# Patient Record
Sex: Female | Born: 1990
Health system: Southern US, Community
[De-identification: ages and names within clinical notes are randomized; demographics above are authoritative.]

## PROBLEM LIST (undated history)

## (undated) DIAGNOSIS — R87629 Unspecified abnormal cytological findings in specimens from vagina: Secondary | ICD-10-CM

## (undated) DIAGNOSIS — T8859XA Other complications of anesthesia, initial encounter: Secondary | ICD-10-CM

## (undated) DIAGNOSIS — T4145XA Adverse effect of unspecified anesthetic, initial encounter: Secondary | ICD-10-CM

## (undated) DIAGNOSIS — R112 Nausea with vomiting, unspecified: Secondary | ICD-10-CM

## (undated) DIAGNOSIS — N200 Calculus of kidney: Secondary | ICD-10-CM

## (undated) DIAGNOSIS — Z9889 Other specified postprocedural states: Secondary | ICD-10-CM

## (undated) DIAGNOSIS — R011 Cardiac murmur, unspecified: Secondary | ICD-10-CM

## (undated) DIAGNOSIS — J45909 Unspecified asthma, uncomplicated: Secondary | ICD-10-CM

## (undated) DIAGNOSIS — B019 Varicella without complication: Secondary | ICD-10-CM

## (undated) DIAGNOSIS — T7840XA Allergy, unspecified, initial encounter: Secondary | ICD-10-CM

## (undated) DIAGNOSIS — J40 Bronchitis, not specified as acute or chronic: Secondary | ICD-10-CM

## (undated) DIAGNOSIS — B977 Papillomavirus as the cause of diseases classified elsewhere: Secondary | ICD-10-CM

## (undated) DIAGNOSIS — I499 Cardiac arrhythmia, unspecified: Secondary | ICD-10-CM

## (undated) DIAGNOSIS — A749 Chlamydial infection, unspecified: Secondary | ICD-10-CM

## (undated) HISTORY — PX: WISDOM TOOTH EXTRACTION: SHX21

## (undated) HISTORY — DX: Varicella without complication: B01.9

## (undated) HISTORY — DX: Allergy, unspecified, initial encounter: T78.40XA

## (undated) HISTORY — DX: Unspecified asthma, uncomplicated: J45.909

## (undated) HISTORY — PX: LEEP: SHX91

## (undated) HISTORY — DX: Bronchitis, not specified as acute or chronic: J40

---
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
## 2010-09-04 ENCOUNTER — Emergency Department (HOSPITAL_COMMUNITY)
Admission: EM | Admit: 2010-09-04 | Discharge: 2010-09-04 | Payer: Self-pay | Source: Home / Self Care | Admitting: Emergency Medicine

## 2013-11-22 ENCOUNTER — Emergency Department: Payer: Self-pay | Admitting: Emergency Medicine

## 2014-03-01 ENCOUNTER — Encounter: Payer: Self-pay | Admitting: Adult Health

## 2014-03-01 ENCOUNTER — Ambulatory Visit (INDEPENDENT_AMBULATORY_CARE_PROVIDER_SITE_OTHER): Payer: BC Managed Care – PPO | Admitting: Adult Health

## 2014-03-01 VITALS — BP 100/66 | HR 72 | Temp 98.3°F | Resp 14 | Ht 67.0 in | Wt 147.8 lb

## 2014-03-01 DIAGNOSIS — Z0000 Encounter for general adult medical examination without abnormal findings: Secondary | ICD-10-CM

## 2014-03-01 LAB — COMPREHENSIVE METABOLIC PANEL
ALBUMIN: 3.7 g/dL (ref 3.5–5.2)
ALK PHOS: 40 U/L (ref 39–117)
ALT: 21 U/L (ref 0–35)
AST: 24 U/L (ref 0–37)
BUN: 16 mg/dL (ref 6–23)
CO2: 27 mEq/L (ref 19–32)
Calcium: 9.3 mg/dL (ref 8.4–10.5)
Chloride: 104 mEq/L (ref 96–112)
Creatinine, Ser: 1 mg/dL (ref 0.4–1.2)
GFR: 75.91 mL/min (ref >60.00)
GLUCOSE: 77 mg/dL (ref 70–99)
POTASSIUM: 4.9 meq/L (ref 3.5–5.1)
SODIUM: 136 meq/L (ref 135–145)
TOTAL PROTEIN: 6.5 g/dL (ref 6.0–8.3)
Total Bilirubin: 0.6 mg/dL (ref 0.2–1.2)

## 2014-03-01 LAB — CBC WITH DIFFERENTIAL/PLATELET
BASOS ABS: 0 10*3/uL (ref 0.0–0.1)
Basophils Relative: 0.3 % (ref 0.0–3.0)
EOS ABS: 0.2 10*3/uL (ref 0.0–0.7)
Eosinophils Relative: 2.6 % (ref 0.0–5.0)
HCT: 41.3 % (ref 36.0–46.0)
HEMOGLOBIN: 14 g/dL (ref 12.0–15.0)
LYMPHS PCT: 47.2 % — AB (ref 12.0–46.0)
Lymphs Abs: 2.8 10*3/uL (ref 0.7–4.0)
MCHC: 33.9 g/dL (ref 30.0–36.0)
MCV: 90.4 fl (ref 78.0–100.0)
Monocytes Absolute: 0.3 10*3/uL (ref 0.1–1.0)
Monocytes Relative: 5.4 % (ref 3.0–12.0)
NEUTROS ABS: 2.6 10*3/uL (ref 1.4–7.7)
NEUTROS PCT: 44.5 % (ref 43.0–77.0)
Platelets: 213 10*3/uL (ref 150.0–400.0)
RBC: 4.57 Mil/uL (ref 3.87–5.11)
RDW: 13.3 % (ref 11.5–15.5)
WBC: 5.9 10*3/uL (ref 4.0–10.5)

## 2014-03-01 LAB — VITAMIN D 25 HYDROXY (VIT D DEFICIENCY, FRACTURES): VITD: 80.94 ng/mL (ref 30.00–100.00)

## 2014-03-01 LAB — LIPID PANEL
CHOLESTEROL: 194 mg/dL (ref 0–200)
HDL: 65.3 mg/dL (ref >39.00)
LDL CALC: 108 mg/dL — AB (ref 0–99)
NONHDL: 128.7
Total CHOL/HDL Ratio: 3
Triglycerides: 104 mg/dL (ref 0.0–149.0)
VLDL: 20.8 mg/dL (ref 0.0–40.0)

## 2014-03-01 LAB — TSH: TSH: 3.84 u[IU]/mL (ref 0.35–4.50)

## 2014-03-01 LAB — VITAMIN B12: Vitamin B-12: 792 pg/mL (ref 211–911)

## 2014-03-01 MED ORDER — EPINEPHRINE 0.3 MG/0.3ML IJ SOAJ: 0.3000 mg | Freq: Once | INTRAMUSCULAR | Status: DC

## 2014-03-01 NOTE — Progress Notes (Signed)
Pre visit review using our clinic review tool, if applicable. No additional management support is needed unless otherwise documented below in the visit note. 

## 2014-03-01 NOTE — Progress Notes (Signed)
Patient ID: Lori Blackwell, female   DOB: 1990-09-26, 23 y.o.   MRN: [id number]   Subjective:    Patient ID: Lori Blackwell, female    DOB: 05-11-1991, 23 y.o.   MRN: [id number]  HPI Lori Blackwell is a very pleasant 23 y/o female who presents to establish care. She is feeling well. No current concerns. She has a hx of shellfish allergies and needs refill on Epi Pen. She also reports family hx of hypoglycemia. She believes she may have occasional symptoms when she spends too much time without eating. No syncope.   Past Medical History  Diagnosis Date  . Asthma   . Allergy   . Bronchitis      Past Surgical History  Procedure Laterality Date  . Appendectomy  2001  . Shoulder surgery Right 2008    2011     Family History  Problem Relation Age of Onset  . Hyperlipidemia Mother   . Cancer Maternal Aunt     breast cancer  . Cancer Maternal Grandmother     breast cancer     History   Social History  . Marital Status: Single    Spouse Name: N/A    Number of Children: 0  . Years of Education: 16   Occupational History  . Kindergarten Teacher     [employer]   Social History Main Topics  . Smoking status: Never Smoker   . Smokeless tobacco: Never Used  . Alcohol Use: Yes     Comment: 1-3 drinks weekly  . Drug Use: No  . Sexual Activity: Not on file   Other Topics Concern  . Not on file   Social History Narrative   Lori Blackwell grew up in [place]. 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 She is working as a Oncologist at [employer]. She lives in [place].      Hobbies: Running, paint with acrylics   Exercise: Regularly attends [employer] and does weights, interval training, cardio   Diet: Follows clean eating and allows herself 3 cheat meals weekly.     Review of Systems  Constitutional: Negative.   HENT: Negative.   Eyes: Negative.   Respiratory: Negative.   Cardiovascular: Negative.     Gastrointestinal: Negative.   Endocrine: Negative.   Genitourinary: Negative.   Musculoskeletal: Negative.   Skin: Negative.   Allergic/Immunologic: Negative.   Neurological: Negative.   Hematological: Negative.   Psychiatric/Behavioral: Negative.        Objective:  BP 100/66  Pulse 72  Temp(Src) 98.3 F (36.8 C) (Oral)  Resp 14  Ht 5\' 7"  (1.702 m)  Wt 147 lb 12 oz (67.019 kg)  BMI 23.14 kg/m2  SpO2 99%  LMP 02/15/2014   Physical Exam  Constitutional: She is oriented to person, place, and time. She appears well-developed and well-nourished. No distress.  HENT:  Head: Normocephalic and atraumatic.  Right Ear: External ear normal.  Left Ear: External ear normal.  Nose: Nose normal.  Mouth/Throat: Oropharynx is clear and moist.  Eyes: Conjunctivae and EOM are normal. Pupils are equal, round, and reactive to light.  Neck: Normal range of motion. Neck supple. No tracheal deviation present. No thyromegaly present.  Cardiovascular: Normal rate, regular rhythm, normal heart sounds and intact distal pulses.  Exam reveals no gallop and no friction rub.   No murmur heard. Pulmonary/Chest: Effort normal and breath sounds normal. No respiratory distress. She has no wheezes. She has no rales.  Abdominal: Soft. Bowel sounds are  normal. She exhibits no distension and no mass. There is no tenderness. There is no rebound and no guarding.  Musculoskeletal: Normal range of motion. She exhibits no edema and no tenderness.  Lymphadenopathy:    She has no cervical adenopathy.  Neurological: She is alert and oriented to person, place, and time. She has normal reflexes. No cranial nerve deficit. Coordination normal.  Skin: Skin is warm and dry.  Psychiatric: She has a normal mood and affect. Her behavior is normal. Judgment and thought content normal.      Assessment & Plan:   1. Routine general medical examination at a health care facility Normal physical exam. She is followed by GYN and  up to date on PAP which was normal. Check labs. Return in 1 year.  - TSH - CBC with Differential - Vit D  25 hydroxy (rtn osteoporosis monitoring) - Vitamin B12 - Lipid panel - Comprehensive metabolic panel

## 2014-03-01 NOTE — Patient Instructions (Signed)
   Thank you for choosing [facility] for your health care needs.  Please have your labs drawn prior to leaving the office.  The results will be available through MyChart for your convenience. Please remember to activate this. The activation code is located at the end of this form.  I have sent in a prescription for Epi Pen to your pharmacy along with refills. Keep this with you at all times.

## 2014-05-09 ENCOUNTER — Ambulatory Visit (INDEPENDENT_AMBULATORY_CARE_PROVIDER_SITE_OTHER): Payer: BC Managed Care – PPO | Admitting: Internal Medicine

## 2014-05-09 ENCOUNTER — Encounter: Payer: Self-pay | Admitting: Internal Medicine

## 2014-05-09 ENCOUNTER — Encounter (INDEPENDENT_AMBULATORY_CARE_PROVIDER_SITE_OTHER): Payer: Self-pay

## 2014-05-09 VITALS — BP 98/52 | HR 70 | Temp 98.4°F | Wt 150.0 lb

## 2014-05-09 DIAGNOSIS — T3695XA Adverse effect of unspecified systemic antibiotic, initial encounter: Secondary | ICD-10-CM

## 2014-05-09 DIAGNOSIS — J0100 Acute maxillary sinusitis, unspecified: Secondary | ICD-10-CM

## 2014-05-09 DIAGNOSIS — B379 Candidiasis, unspecified: Secondary | ICD-10-CM

## 2014-05-09 MED ORDER — AMOXICILLIN-POT CLAVULANATE 875-125 MG PO TABS: 1.0000 | ORAL_TABLET | Freq: Two times a day (BID) | ORAL | Status: DC

## 2014-05-09 MED ORDER — FLUCONAZOLE 150 MG PO TABS: 150.0000 mg | ORAL_TABLET | Freq: Once | ORAL | Status: DC

## 2014-05-09 NOTE — Progress Notes (Signed)
HPI  Pt presents to the clinic today with c/o nasal congestion, post nasal drip and bilateral ear pain. She reports this started 2-3 weeks ago. She is blowing green mucous out of her nose. She denies fever but has had chills and body aches. She has tried sudafed, ibuprofen and dayquil without any relief. She does have a history of allergies and asthma. She also reports a history of recurrent sinus infections. She has not had sick contacts that she is aware of.  Review of Systems      Past Medical History  Diagnosis Date  . Asthma   . Allergy   . Bronchitis     Family History  Problem Relation Age of Onset  . Hyperlipidemia Mother   . Cancer Maternal Aunt     breast cancer  . Cancer Maternal Grandmother     breast cancer    History   Social History  . Marital Status: Single    Spouse Name: N/A    Number of Children: 0  . Years of Education: 16   Occupational History  . Kindergarten Teacher     [employer]   Social History Main Topics  . Smoking status: Never Smoker   . Smokeless tobacco: Never Used  . Alcohol Use: Yes     Comment: 1-3 drinks weekly  . Drug Use: No  . Sexual Activity: Not on file   Other Topics Concern  . Not on file   Social History Narrative   Lori Blackwell grew up in [place]. 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      Hobbies: Running, paint with acrylics   Exercise: Regularly attends [employer] and does weights, interval training, cardio   Diet: Follows clean eating and allows herself 3 cheat meals weekly.    Allergies  Allergen Reactions  . Shellfish Allergy Anaphylaxis  . Sulfa Antibiotics Rash     Constitutional:  Denies headache, fatigue, fever or abrupt weight changes.  HEENT:  Positive nasal congestion, ear fullness, sore throat. Denies eye redness, eye pain, pressure behind the eyes, facial pain,   ear pain, ringing in the ears, wax buildup, runny nose or bloody nose. Respiratory:  Denies cough, difficulty breathing or shortness of breath.  Cardiovascular: Denies chest pain, chest tightness, palpitations or swelling in the hands or feet.   No other specific complaints in a complete review of systems (except as listed in HPI above).  Objective:   BP 98/52  Pulse 70  Temp(Src) 98.4 F (36.9 C) (Oral)  Wt 150 lb (68.04 kg)  SpO2 99%  Wt Readings from Last 3 Encounters:  05/09/14 150 lb (68.04 kg)  03/01/14 147 lb 12 oz (67.019 kg)     General: Appears her stated age, well developed, well nourished in NAD. HEENT: Head: normal shape and size; maxillary and frontal tenderness; Ears: Tm's gray and intact, normal light reflex; Nose: mucosa pink and moist, septum midline; Throat/Mouth: + PND. Teeth present, mucosa erythematous and moist, no exudate noted, no lesions or ulcerations noted.  Cardiovascular: Normal rate and rhythm. S1,S2 noted.  No murmur, rubs or gallops noted. No JVD or BLE edema. No carotid bruits noted. Pulmonary/Chest: Normal effort and positive vesicular breath sounds. No respiratory distress. No wheezes, rales or ronchi noted.      Assessment & Plan:   Acute Bacterial Sinusitis:  Get some rest and drink plenty of water Do  salt water gargles for the sore throat Start flonase OTC eRx for Augmentin BID x 10 days eRx for diflucan for antibiotic induced yeast infection  RTC as needed or if symptoms persist.

## 2014-05-09 NOTE — Patient Instructions (Addendum)

## 2014-05-09 NOTE — Progress Notes (Signed)
Pre visit review using our clinic review tool, if applicable. No additional management support is needed unless otherwise documented below in the visit note. 

## 2014-06-20 LAB — HM PAP SMEAR: HM Pap smear: NORMAL

## 2014-07-26 ENCOUNTER — Encounter: Payer: Self-pay | Admitting: Internal Medicine

## 2014-07-26 ENCOUNTER — Ambulatory Visit (INDEPENDENT_AMBULATORY_CARE_PROVIDER_SITE_OTHER): Payer: BC Managed Care – PPO | Admitting: Internal Medicine

## 2014-07-26 VITALS — BP 106/68 | HR 69 | Temp 98.2°F | Wt 150.0 lb

## 2014-07-26 DIAGNOSIS — J0100 Acute maxillary sinusitis, unspecified: Secondary | ICD-10-CM

## 2014-07-26 MED ORDER — ALBUTEROL SULFATE HFA 108 (90 BASE) MCG/ACT IN AERS
2.0000 | INHALATION_SPRAY | Freq: Four times a day (QID) | RESPIRATORY_TRACT | Status: DC | PRN
Start: 2014-07-26 — End: 2015-09-16

## 2014-07-26 MED ORDER — CETIRIZINE HCL 10 MG PO TABS: 10.0000 mg | ORAL_TABLET | Freq: Every day | ORAL | Status: DC

## 2014-07-26 MED ORDER — AMOXICILLIN 500 MG PO CAPS: 500.0000 mg | ORAL_CAPSULE | Freq: Three times a day (TID) | ORAL | Status: DC

## 2014-07-26 NOTE — Progress Notes (Signed)
Pre visit review using our clinic review tool, if applicable. No additional management support is needed unless otherwise documented below in the visit note. 

## 2014-07-26 NOTE — Patient Instructions (Signed)

## 2014-07-26 NOTE — Progress Notes (Signed)
HPI  Pt presents to the clinic today with c/o headache, facial pressure, nasal congestion, ear fullness, sore throat and cough. She reports this started 3 weeks ago. She is blowing green mucous from her nose. She is also coughing up green mucous. She denies fever but has had chills and body aches. She has tried Nyqil and Dayquil without minimal relief. She does have a history of asthma and allergies. She is not taking any zyrtec or nasocort. She has had sick contacts.  Review of Systems      Past Medical History  Diagnosis Date  . Asthma   . Allergy   . Bronchitis     Family History  Problem Relation Age of Onset  . Hyperlipidemia Mother   . Cancer Maternal Aunt     breast cancer  . Cancer Maternal Grandmother     breast cancer    History   Social History  . Marital Status: Single    Spouse Name: N/A    Number of Children: 0  . Years of Education: 16   Occupational History  . Kindergarten Teacher     [employer]   Social History Main Topics  . Smoking status: Never Smoker   . Smokeless tobacco: Never Used  . Alcohol Use: Yes     Comment: 1-3 drinks weekly  . Drug Use: No  . Sexual Activity: Not on file   Other Topics Concern  . Not on file   Social History Narrative   Lori Blackwell grew up in [place]. 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      Hobbies: Running, paint with acrylics   Exercise: Regularly attends [employer] and does weights, interval training, cardio   Diet: Follows clean eating and allows herself 3 cheat meals weekly.    Allergies  Allergen Reactions  . Shellfish Allergy Anaphylaxis  . Sulfa Antibiotics Rash     Constitutional: Positive headache, fatigue. Denies fever or abrupt weight changes.  HEENT:  Positive ear fullness, nasal congestion, sore throat. Denies eye redness, eye pain, pressure behind the  eyes, facial pain, ear pain, ringing in the ears, wax buildup, runny nose or bloody nose. Respiratory: Positive cough. Denies difficulty breathing or shortness of breath.  Cardiovascular: Denies chest pain, chest tightness, palpitations or swelling in the hands or feet.   No other specific complaints in a complete review of systems (except as listed in HPI above).  Objective:   BP 106/68 mmHg  Pulse 69  Temp(Src) 98.2 F (36.8 C) (Oral)  Wt 150 lb (68.04 kg)  SpO2 98% Wt Readings from Last 3 Encounters:  07/26/14 150 lb (68.04 kg)  05/09/14 150 lb (68.04 kg)  03/01/14 147 lb 12 oz (67.019 kg)     General: Appears her stated age, ill appearing in NAD. HEENT: Head: normal shape and size, maxillary sinus tenderness noted; Eyes: sclera white, no icterus, conjunctiva pink; Ears: Tm's gray and intact, normal light reflex, + effusion bilaterally; Nose: mucosa boggy and moist, septum midline; Throat/Mouth: + PND. Teeth present, mucosa erythematous and moist, no exudate noted, no lesions or ulcerations noted.  Neck: Cervical adenopathy noted. Cardiovascular: Normal rate and rhythm. S1,S2 noted.  No murmur, rubs or gallops noted. . Pulmonary/Chest: Normal effort and positive vesicular breath sounds. No respiratory distress. No wheezes, rales or ronchi noted.      Assessment & Plan:   Acute Maxillary Sinusitis:  Get some rest and drink plenty of water Do salt water gargles/ibuprofen for the sore throat eRx for Amoxil TID x 10 days Delsym OTC for cough  RTC as needed or if symptoms persist.

## 2014-09-13 ENCOUNTER — Ambulatory Visit (INDEPENDENT_AMBULATORY_CARE_PROVIDER_SITE_OTHER): Payer: BC Managed Care – PPO | Admitting: Nurse Practitioner

## 2014-09-13 ENCOUNTER — Encounter: Payer: Self-pay | Admitting: Nurse Practitioner

## 2014-09-13 ENCOUNTER — Encounter (INDEPENDENT_AMBULATORY_CARE_PROVIDER_SITE_OTHER): Payer: Self-pay

## 2014-09-13 VITALS — BP 110/64 | HR 79 | Temp 98.0°F | Resp 12 | Ht 67.0 in | Wt 148.8 lb

## 2014-09-13 DIAGNOSIS — B9689 Other specified bacterial agents as the cause of diseases classified elsewhere: Secondary | ICD-10-CM

## 2014-09-13 DIAGNOSIS — J019 Acute sinusitis, unspecified: Secondary | ICD-10-CM

## 2014-09-13 MED ORDER — AMOXICILLIN 500 MG PO TABS: 500.0000 mg | ORAL_TABLET | Freq: Two times a day (BID) | ORAL | Status: DC

## 2014-09-13 MED ORDER — TRIAMCINOLONE ACETONIDE 55 MCG/ACT NA AERO: 2.0000 | INHALATION_SPRAY | Freq: Every day | NASAL | Status: DC

## 2014-09-13 MED ORDER — FLUCONAZOLE 150 MG PO TABS: 150.0000 mg | ORAL_TABLET | Freq: Once | ORAL | Status: DC

## 2014-09-13 MED ORDER — PSEUDOEPHEDRINE-CODEINE-GG 30-10-100 MG/5ML PO SOLN: 5.0000 mL | Freq: Every evening | ORAL | Status: DC | PRN

## 2014-09-13 NOTE — Progress Notes (Signed)
   Subjective:    Patient ID: Lori Blackwell, female    DOB: Mar 16, 1991, 24 y.o.   MRN: [id number]  HPI  Lori Blackwell is a 24 yo female with a CC of cough with green/brown phlegm x 10 days.   1) Worsening- left side of face is painful, swollen, teeth hurt, coughing- productive  Nasal drainage brown and green No fevers   Tylenol cold/flu- not helpful  Mucinex DM- Helpful   Review of Systems  Constitutional: Positive for fatigue. Negative for fever, chills and diaphoresis.  HENT: Positive for congestion, ear pain, facial swelling, postnasal drip, rhinorrhea, sinus pressure, sneezing and sore throat. Negative for ear discharge.   Eyes: Positive for visual disturbance.       Blurred vision yesterday, fatigue related  Respiratory: Positive for cough and wheezing. Negative for chest tightness.   Cardiovascular: Negative for chest pain, palpitations and leg swelling.  Gastrointestinal: Negative for nausea, vomiting and diarrhea.  Skin: Negative for rash.  Neurological: Positive for headaches. Negative for light-headedness.       Objective:   Physical Exam  Constitutional: She is oriented to person, place, and time. She appears well-developed and well-nourished. No distress.  BP 110/64 mmHg  Pulse 79  Temp(Src) 98 F (36.7 C) (Oral)  Resp 12  Ht 5\' 7"  (1.702 m)  Wt 148 lb 12.8 oz (67.495 kg)  BMI 23.30 kg/m2  SpO2 97%  LMP  (Approximate)   HENT:  Head: Normocephalic and atraumatic.  Right Ear: External ear normal.  Left Ear: External ear normal.  Cardiovascular: Normal rate, regular rhythm, normal heart sounds and intact distal pulses.  Exam reveals no gallop and no friction rub.   No murmur heard. Pulmonary/Chest: Effort normal and breath sounds normal. No respiratory distress. She has no wheezes. She has no rales. She exhibits no tenderness.  Neurological: She is alert and oriented to person, place, and time. No cranial nerve deficit. She exhibits normal muscle tone.  Coordination normal.  Skin: Skin is warm and dry. No rash noted. She is not diaphoretic.  Psychiatric: She has a normal mood and affect. Her behavior is normal. Judgment and thought content normal.      Assessment & Plan:

## 2014-09-13 NOTE — Patient Instructions (Signed)
Call us if worsens or fails to improve.

## 2014-09-13 NOTE — Progress Notes (Signed)
Pre visit review using our clinic review tool, if applicable. No additional management support is needed unless otherwise documented below in the visit note. 

## 2014-09-14 DIAGNOSIS — J019 Acute sinusitis, unspecified: Principal | ICD-10-CM

## 2014-09-14 DIAGNOSIS — B9689 Other specified bacterial agents as the cause of diseases classified elsewhere: Secondary | ICD-10-CM | POA: Insufficient documentation

## 2014-09-14 NOTE — Assessment & Plan Note (Signed)
10 days of worsening. Will try a amoxicillin 500 mg twice daily for 10 days. Diflucan in case of yeast infection. FU prn worsening/failure to improve.

## 2015-05-06 ENCOUNTER — Encounter: Payer: Self-pay | Admitting: Nurse Practitioner

## 2015-05-06 ENCOUNTER — Ambulatory Visit (INDEPENDENT_AMBULATORY_CARE_PROVIDER_SITE_OTHER): Payer: BC Managed Care – PPO | Admitting: Nurse Practitioner

## 2015-05-06 VITALS — BP 106/68 | HR 70 | Temp 98.5°F | Resp 14 | Ht 67.0 in | Wt 146.2 lb

## 2015-05-06 DIAGNOSIS — H1010 Acute atopic conjunctivitis, unspecified eye: Secondary | ICD-10-CM | POA: Insufficient documentation

## 2015-05-06 DIAGNOSIS — H1012 Acute atopic conjunctivitis, left eye: Secondary | ICD-10-CM | POA: Diagnosis not present

## 2015-05-06 NOTE — Assessment & Plan Note (Addendum)
Probable start to allergic conjunctivitis. Gave AVS with information regarding treatment and course of illness. Advised pt to get Visine-A eye drops for relief of symptoms and try another allergy medication like Claritin or Allegra. FU prn worsening/failure to improve.

## 2015-05-06 NOTE — Patient Instructions (Signed)
Visine A- over the counter for relief of eye symptoms.  Try another medication for allergies like Claritin or Allegra  Allergic Conjunctivitis The conjunctiva is a thin membrane that covers the visible white part of the eyeball and the underside of the eyelids. This membrane protects and lubricates the eye. The membrane has small blood vessels running through it that can normally be seen. When the conjunctiva becomes inflamed, the condition is called conjunctivitis. In response to the inflammation, the conjunctival blood vessels become swollen. The swelling results in redness in the normally white part of the eye. The blood vessels of this membrane also react when a person has allergies and is then called allergic conjunctivitis. This condition usually lasts for as long as the allergy persists. Allergic conjunctivitis cannot be passed to another person (non-contagious). The likelihood of bacterial infection is great and the cause is not likely due to allergies if the inflamed eye has:  A sticky discharge.  Discharge or sticking together of the lids in the morning.  Scaling or flaking of the eyelids where the eyelashes come out.  Red swollen eyelids. CAUSES   Viruses.  Irritants such as foreign bodies.  Chemicals.  General allergic reactions.  Inflammation or serious diseases in the inside or the outside of the eye or the orbit (the boney cavity in which the eye sits) can cause a "red eye." SYMPTOMS   Eye redness.  Tearing.  Itchy eyes.  Burning feeling in the eyes.  Clear drainage from the eye.  Allergic reaction due to pollens or ragweed sensitivity. Seasonal allergic conjunctivitis is frequent in the spring when pollens are in the air and in the fall. DIAGNOSIS  This condition, in its many forms, is usually diagnosed based on the history and an ophthalmological exam. It usually involves both eyes. If your eyes react at the same time every year, allergies may be the cause.  While most "red eyes" are due to allergy or an infection, the role of an eye (ophthalmological) exam is important. The exam can rule out serious diseases of the eye or orbit. TREATMENT   Non-antibiotic eye drops, ointments, or medications by mouth may be prescribed if the ophthalmologist is sure the conjunctivitis is due to allergies alone.  Over-the-counter drops and ointments for allergic symptoms should be used only after other causes of conjunctivitis have been ruled out, or as your caregiver suggests. Medications by mouth are often prescribed if other allergy-related symptoms are present. If the ophthalmologist is sure that the conjunctivitis is due to allergies alone, treatment is normally limited to drops or ointments to reduce itching and burning. HOME CARE INSTRUCTIONS   Wash hands before and after applying drops or ointments, or touching the inflamed eye(s) or eyelids.  Do not let the eye dropper tip or ointment tube touch the eyelid when putting medicine in your eye.  Stop using your soft contact lenses and throw them away. Use a new pair of lenses when recovery is complete. You should run through sterilizing cycles at least three times before use after complete recovery if the old soft contact lenses are to be used. Hard contact lenses should be stopped. They need to be thoroughly sterilized before use after recovery.  Itching and burning eyes due to allergies is often relieved by using a cool cloth applied to closed eye(s). SEEK MEDICAL CARE IF:   Your problems do not go away after two or three days of treatment.  Your lids are sticky (especially in the morning when you  wake up) or stick together.  Discharge develops. Antibiotics may be needed either as drops, ointment, or by mouth.  You have extreme light sensitivity.  An oral temperature above 102 F (38.9 C) develops.  Pain in or around the eye or any other visual symptom develops. MAKE SURE YOU:   Understand these  instructions.  Will watch your condition.  Will get help right away if you are not doing well or get worse. Document Released: 10/16/2002 Document Revised: 10/18/2011 Document Reviewed: 09/11/2007 [facility] Patient Information 2015 [place], [place]. This information is not intended to replace advice given to you by your health care provider. Make sure you discuss any questions you have with your health care provider.

## 2015-05-06 NOTE — Progress Notes (Signed)
Patient ID: Lori Blackwell, female    DOB: 07/01/1991  Age: 24 y.o. MRN: [id number]  CC: Conjunctivitis   HPI Lori Blackwell presents for left eye redness x 2 days.   1) Yesterday started with feeling like something was in it.                     Stopped after 1 hour, tender, and red This morning woke up with puffiness and itchy, and redness   Zyrtec- not helpful (took 2 months ago for 1 month)  Nasal spray- helpful   History Lori Blackwell has a past medical history of Asthma; Allergy; and Bronchitis.   She has past surgical history that includes Appendectomy (2001) and Shoulder surgery (Right, 2008).   Her family history includes Cancer in her maternal aunt and maternal grandmother; Hyperlipidemia in her mother.She reports that she has never smoked. She has never used smokeless tobacco. She reports that she drinks alcohol. She reports that she does not use illicit drugs.  Outpatient Prescriptions Prior to Visit  Medication Sig Dispense Refill  . albuterol (PROVENTIL HFA;VENTOLIN HFA) 108 (90 BASE) MCG/ACT inhaler Inhale 2 puffs into the lungs every 6 (six) hours as needed for wheezing or shortness of breath. 1 Inhaler 0  . amoxicillin (AMOXIL) 500 MG tablet Take 1 tablet (500 mg total) by mouth 2 (two) times daily. 20 tablet 0  . cetirizine (ZYRTEC) 10 MG tablet Take 1 tablet (10 mg total) by mouth daily. 30 tablet 11  . EPINEPHrine 0.3 mg/0.3 mL IJ SOAJ injection Inject 0.3 mLs (0.3 mg total) into the muscle once. 2 Device 3  . fluconazole (DIFLUCAN) 150 MG tablet Take 1 tablet (150 mg total) by mouth once. 1 tablet 0  . Multiple Vitamin (MULTI VITAMIN DAILY PO) Take 1 capsule by mouth daily.    . norethindrone-ethinyl estradiol (MICROGESTIN,JUNEL,LOESTRIN) 1-20 MG-MCG tablet Take 1 tablet by mouth daily.    . pseudoephedrine-codeine-guaifenesin (MYTUSSIN DAC) 30-10-100 MG/5ML solution Take 5 mLs by mouth at bedtime as needed for cough. 120 mL 0  . triamcinolone (NASACORT AQ) 55 MCG/ACT  AERO nasal inhaler Place 2 sprays into the nose daily. 1 Inhaler 2   No facility-administered medications prior to visit.    ROS Review of Systems  Constitutional: Negative for fever, chills, diaphoresis and fatigue.  HENT: Positive for sinus pressure.   Eyes: Positive for pain, discharge, redness and itching. Negative for visual disturbance.       Left eye watery  Respiratory: Negative for chest tightness, shortness of breath and wheezing.   Cardiovascular: Negative for chest pain, palpitations and leg swelling.  Gastrointestinal: Negative for nausea, vomiting and diarrhea.  Skin: Negative for rash.  Neurological: Negative for dizziness, weakness, numbness and headaches.  Psychiatric/Behavioral: The patient is not nervous/anxious.     Objective:  BP 106/68 mmHg  Pulse 70  Temp(Src) 98.5 F (36.9 C)  Resp 14  Ht 5\' 7"  (1.702 m)  Wt 146 lb 3.2 oz (66.316 kg)  BMI 22.89 kg/m2  SpO2 98%  Physical Exam  Constitutional: She is oriented to person, place, and time. She appears well-developed and well-nourished. No distress.  HENT:  Head: Normocephalic and atraumatic.  Right Ear: External ear normal.  Left Ear: External ear normal.  Mouth/Throat: No oropharyngeal exudate.  TM's clear bilaterally  Eyes: EOM are normal. Pupils are equal, round, and reactive to light. Right eye exhibits no discharge. Left eye exhibits no discharge. No scleral icterus.  Watery left eye with some redness medially,  normal conjunctiva  Cardiovascular: Normal rate, regular rhythm and normal heart sounds.  Exam reveals no gallop and no friction rub.   No murmur heard. Pulmonary/Chest: Effort normal and breath sounds normal. No respiratory distress. She has no wheezes. She has no rales. She exhibits no tenderness.  Neurological: She is alert and oriented to person, place, and time. No cranial nerve deficit. She exhibits normal muscle tone. Coordination normal.  Skin: Skin is warm and dry. No rash noted. She  is not diaphoretic.  Psychiatric: She has a normal mood and affect. Her behavior is normal. Judgment and thought content normal.   Assessment & Plan:   Lori Blackwell was seen today for conjunctivitis.  Diagnoses and all orders for this visit:  Allergic conjunctivitis, left   I am having Ms. Blackwell maintain her norethindrone-ethinyl estradiol, Multiple Vitamin (MULTI VITAMIN DAILY PO), EPINEPHrine, albuterol, cetirizine, pseudoephedrine-codeine-guaifenesin, amoxicillin, fluconazole, and triamcinolone.  No orders of the defined types were placed in this encounter.     Follow-up: Return if symptoms worsen or fail to improve.

## 2015-06-18 ENCOUNTER — Ambulatory Visit (INDEPENDENT_AMBULATORY_CARE_PROVIDER_SITE_OTHER): Payer: BC Managed Care – PPO | Admitting: Nurse Practitioner

## 2015-06-18 ENCOUNTER — Encounter: Payer: Self-pay | Admitting: Nurse Practitioner

## 2015-06-18 VITALS — BP 116/60 | HR 66 | Temp 98.3°F | Wt 146.0 lb

## 2015-06-18 DIAGNOSIS — J019 Acute sinusitis, unspecified: Secondary | ICD-10-CM

## 2015-06-18 DIAGNOSIS — B9689 Other specified bacterial agents as the cause of diseases classified elsewhere: Secondary | ICD-10-CM

## 2015-06-18 MED ORDER — AMOXICILLIN-POT CLAVULANATE 875-125 MG PO TABS: 1.0000 | ORAL_TABLET | Freq: Two times a day (BID) | ORAL | Status: DC

## 2015-06-18 MED ORDER — FLUCONAZOLE 150 MG PO TABS: 150.0000 mg | ORAL_TABLET | Freq: Once | ORAL | Status: DC

## 2015-06-18 MED ORDER — HYDROCOD POLST-CPM POLST ER 10-8 MG/5ML PO SUER: 5.0000 mL | Freq: Every evening | ORAL | Status: DC | PRN

## 2015-06-18 NOTE — Progress Notes (Signed)
Patient ID: Lori Blackwell, female    DOB: 1991/05/09  Age: 24 y.o. MRN: [id number]  CC: Sinusitis   HPI Lori Blackwell presents for CC of sinusitis x 2 weeks.   1) Patient has had symptoms ongoing 2 weeks 1 week of drainage, headache, facial pressure worsening since last week. Maxillary sinus pressure and tooth pain.  Treatment today includes DayQuil and nasal sprays with temporary relief.  Patient was sent home from work today because Lori Blackwell has a burning in the lung, hoarseness, and mucus is green to brown.   History Lori Blackwell has a past medical history of Asthma; Allergy; and Bronchitis.   Lori Blackwell has past surgical history that includes Appendectomy (2001) and Shoulder surgery (Right, 2008).   Lori Blackwell family history includes Cancer in Lori Blackwell maternal aunt and maternal grandmother; Hyperlipidemia in Lori Blackwell mother.Lori Blackwell reports that Lori Blackwell has never smoked. Lori Blackwell has never used smokeless tobacco. Lori Blackwell reports that Lori Blackwell drinks alcohol. Lori Blackwell reports that Lori Blackwell does not use illicit drugs.  Outpatient Prescriptions Prior to Visit  Medication Sig Dispense Refill  . albuterol (PROVENTIL HFA;VENTOLIN HFA) 108 (90 BASE) MCG/ACT inhaler Inhale 2 puffs into the lungs every 6 (six) hours as needed for wheezing or shortness of breath. 1 Inhaler 0  . cetirizine (ZYRTEC) 10 MG tablet Take 1 tablet (10 mg total) by mouth daily. 30 tablet 11  . EPINEPHrine 0.3 mg/0.3 mL IJ SOAJ injection Inject 0.3 mLs (0.3 mg total) into the muscle once. 2 Device 3  . Multiple Vitamin (MULTI VITAMIN DAILY PO) Take 1 capsule by mouth daily.    . norethindrone-ethinyl estradiol (MICROGESTIN,JUNEL,LOESTRIN) 1-20 MG-MCG tablet Take 1 tablet by mouth daily.    . pseudoephedrine-codeine-guaifenesin (MYTUSSIN DAC) 30-10-100 MG/5ML solution Take 5 mLs by mouth at bedtime as needed for cough. 120 mL 0  . triamcinolone (NASACORT AQ) 55 MCG/ACT AERO nasal inhaler Place 2 sprays into the nose daily. 1 Inhaler 2  . amoxicillin (AMOXIL) 500 MG tablet Take 1  tablet (500 mg total) by mouth 2 (two) times daily. 20 tablet 0  . fluconazole (DIFLUCAN) 150 MG tablet Take 1 tablet (150 mg total) by mouth once. 1 tablet 0   No facility-administered medications prior to visit.    ROS Review of Systems  Constitutional: Negative for fever, chills, diaphoresis and fatigue.  HENT: Positive for congestion, ear pain, postnasal drip, rhinorrhea, sinus pressure, sore throat and voice change. Negative for sneezing and trouble swallowing.        Hoarseness  Eyes: Negative for visual disturbance.  Respiratory: Positive for cough and shortness of breath. Negative for chest tightness and wheezing.   Cardiovascular: Negative for chest pain, palpitations and leg swelling.  Gastrointestinal: Negative for nausea, vomiting and diarrhea.  Neurological: Positive for headaches. Negative for dizziness.   Objective:  BP 116/60 mmHg  Pulse 66  Temp(Src) 98.3 F (36.8 C) (Oral)  Wt 146 lb (66.225 kg)  SpO2 96%  Physical Exam  Constitutional: Lori Blackwell is oriented to person, place, and time. Lori Blackwell appears well-developed and well-nourished. No distress.  HENT:  Head: Normocephalic and atraumatic.  Right Ear: External ear normal.  Left Ear: External ear normal.  Mouth/Throat: Oropharynx is clear and moist. No oropharyngeal exudate.  TMs clear bilaterally  Eyes: EOM are normal. Pupils are equal, round, and reactive to light. Right eye exhibits no discharge. Left eye exhibits no discharge. No scleral icterus.  Neck: Normal range of motion. Neck supple.  Cardiovascular: Normal rate, regular rhythm and normal heart sounds.  Exam reveals no gallop and  no friction rub.   No murmur heard. Pulmonary/Chest: Effort normal and breath sounds normal. No respiratory distress. Lori Blackwell has no wheezes. Lori Blackwell has no rales. Lori Blackwell exhibits no tenderness.  Lymphadenopathy:    Lori Blackwell has no cervical adenopathy.  Neurological: Lori Blackwell is alert and oriented to person, place, and time. No cranial nerve deficit.  Lori Blackwell exhibits normal muscle tone. Coordination normal.  Skin: Skin is warm and dry. No rash noted. Lori Blackwell is not diaphoretic.  Psychiatric: Lori Blackwell has a normal mood and affect. Lori Blackwell behavior is normal. Judgment and thought content normal.   Assessment & Plan:   Lori Blackwell was seen today for sinusitis.  Diagnoses and all orders for this visit:  Acute bacterial rhinosinusitis  Other orders -     chlorpheniramine-HYDROcodone (TUSSIONEX PENNKINETIC ER) 10-8 MG/5ML SUER; Take 5 mLs by mouth at bedtime as needed for cough. -     amoxicillin-clavulanate (AUGMENTIN) 875-125 MG tablet; Take 1 tablet by mouth 2 (two) times daily. -     fluconazole (DIFLUCAN) 150 MG tablet; Take 1 tablet (150 mg total) by mouth once.   I have discontinued Lori Blackwell's amoxicillin. I am also having Lori Blackwell start on chlorpheniramine-HYDROcodone and amoxicillin-clavulanate. Additionally, I am having Lori Blackwell maintain Lori Blackwell norethindrone-ethinyl estradiol, Multiple Vitamin (MULTI VITAMIN DAILY PO), EPINEPHrine, albuterol, cetirizine, pseudoephedrine-codeine-guaifenesin, triamcinolone, and fluconazole.  Meds ordered this encounter  Medications  . chlorpheniramine-HYDROcodone (TUSSIONEX PENNKINETIC ER) 10-8 MG/5ML SUER    Sig: Take 5 mLs by mouth at bedtime as needed for cough.    Dispense:  115 mL    Refill:  0    Order Specific Question:  Supervising Provider    Answer:  Derrel Nip, TERESA L [[id number]]  . amoxicillin-clavulanate (AUGMENTIN) 875-125 MG tablet    Sig: Take 1 tablet by mouth 2 (two) times daily.    Dispense:  14 tablet    Refill:  0    Order Specific Question:  Supervising Provider    Answer:  Deborra Medina L [[id number]]  . fluconazole (DIFLUCAN) 150 MG tablet    Sig: Take 1 tablet (150 mg total) by mouth once.    Dispense:  1 tablet    Refill:  0    Order Specific Question:  Supervising Provider    Answer:  Crecencio Mc [[id number]]     Follow-up: Return if symptoms worsen or fail to improve.

## 2015-06-18 NOTE — Assessment & Plan Note (Signed)
Worsening symptoms over 2 weeks. Will do Augmentin twice daily for 7 days. Sending and Diflucan just in case. Gave patient cough syrup prescription to take into pharmacy with precautions outlined verbally and written.

## 2015-06-18 NOTE — Patient Instructions (Signed)

## 2015-06-30 ENCOUNTER — Telehealth: Payer: Self-pay | Admitting: *Deleted

## 2015-06-30 NOTE — Telephone Encounter (Signed)
Patient was seen for a Sinus infection a little  over a week, however her sinus infection has not cleared up, patient requested another antibiotic, or appt. Please advise

## 2015-06-30 NOTE — Telephone Encounter (Signed)
Please advise 

## 2015-07-01 ENCOUNTER — Other Ambulatory Visit: Payer: Self-pay | Admitting: Nurse Practitioner

## 2015-07-01 LAB — HM PAP SMEAR

## 2015-07-01 MED ORDER — METHYLPREDNISOLONE 4 MG PO TABS: ORAL_TABLET | ORAL | Status: DC

## 2015-07-01 NOTE — Telephone Encounter (Signed)
Spoke with the patient, verbalized understanding.

## 2015-07-01 NOTE — Telephone Encounter (Signed)
I will call in a round of prednisone to her pharmacy. Here are the directions:   Prednisone with breakfast or lunch at the latest.  6 tablets on day 1, 5 tablets on day 2, 4 tablets on day 3, 3 tablets on day 4, 2 tablets day 5, 1 tablet on day 6...done! Take tablets all together not spaced out Don't take with NSAIDs (Ibuprofen, Aleve, Naproxen, Meloxicam ect...)

## 2015-09-16 ENCOUNTER — Ambulatory Visit (INDEPENDENT_AMBULATORY_CARE_PROVIDER_SITE_OTHER): Payer: BC Managed Care – PPO | Admitting: Family Medicine

## 2015-09-16 ENCOUNTER — Encounter: Payer: Self-pay | Admitting: Family Medicine

## 2015-09-16 VITALS — BP 108/62 | HR 72 | Temp 98.2°F | Ht 67.0 in | Wt 148.4 lb

## 2015-09-16 DIAGNOSIS — J329 Chronic sinusitis, unspecified: Secondary | ICD-10-CM | POA: Diagnosis not present

## 2015-09-16 MED ORDER — PREDNISONE 50 MG PO TABS: ORAL_TABLET | ORAL | Status: DC

## 2015-09-16 NOTE — Patient Instructions (Signed)
Take the prednisone as prescribed.  We will be in touch regarding your ENT appt.  Take care  Dr. Lacinda Axon

## 2015-09-16 NOTE — Progress Notes (Signed)
Subjective:  Patient ID: Lori Blackwell, female    DOB: 04-20-1991  Age: 25 y.o. MRN: [id number]  CC: ? Sinus infection  HPI:  25 year old female presents to clinic today with concerns that she may have a sinus infection.  Upon review of electronic medical record, it appears the patient has been diagnosed and treated for bacterial sinusitis twice in the past year and 4 times in the past 2 years.  She presents today with complaints of sinus pain and pressure. She reports associated congestion and dental pain. She reports that she feels stopped up. She's taking DayQuil and NyQuil with no relief. No associated fever. No current drainage. This is been going on for approximately month. No known exacerbating factors. 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  Social Hx   Social History   Social History  . Marital Status: Single    Spouse Name: N/A  . Number of Children: 0  . Years of Education: 16   Occupational History  . Kindergarten Teacher     [employer]   Social History Main Topics  . Smoking status: Never Smoker   . Smokeless tobacco: Never Used  . Alcohol Use: Yes     Comment: 1-3 drinks weekly  . Drug Use: No  . Sexual Activity: Not Asked   Other Topics Concern  . None   Social History Narrative   Brentley grew up in [place]. 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 She is working as a Oncologist at [employer]. She lives in [place].      Hobbies: Running, paint with acrylics   Exercise: Regularly attends [employer] and does weights, interval training, cardio   Diet: Follows clean eating and allows herself 3 cheat meals weekly.   Review of Systems  Constitutional: Negative for fever.  HENT: Positive for congestion and sinus pressure.    Objective:  BP 108/62 mmHg  Pulse 72  Temp(Src) 98.2 F (36.8 C) (Oral)  Ht 5\' 7"  (1.702 m)  Wt 148 lb 6 oz (67.302 kg)   BMI 23.23 kg/m2  SpO2 98%  BP/Weight 09/16/2015 06/18/2015 [id number]  Systolic BP 123XX123 [id number]  Diastolic BP 62 60 68  Wt. (Lbs) 148.38 146 146.2  BMI 23.23 22.86 22.89   Physical Exam  Constitutional: She is oriented to person, place, and time. She appears well-developed. No distress.  HENT:  Head: Normocephalic and atraumatic.  Mouth/Throat: Oropharynx is clear and moist. No oropharyngeal exudate.  Normal TMs bilaterally. Maxillary sinus tenderness to palpation.  Eyes: Conjunctivae are normal.  Neck: Neck supple.  Cardiovascular: Normal rate and regular rhythm.   Pulmonary/Chest: Effort normal and breath sounds normal.  Lymphadenopathy:    She has no cervical adenopathy.  Neurological: She is alert and oriented to person, place, and time.  Psychiatric: She has a normal mood and affect.  Vitals reviewed.  Lab Results  Component Value Date   WBC 5.9 03/01/2014   HGB 14.0 03/01/2014   HCT 41.3 03/01/2014   PLT 213.0 03/01/2014   GLUCOSE 77 03/01/2014   CHOL 194 03/01/2014   TRIG 104.0 03/01/2014   HDL 65.30 03/01/2014   LDLCALC 108* 03/01/2014   ALT 21 03/01/2014   AST 24 03/01/2014   NA 136 03/01/2014   K 4.9 03/01/2014   CL 104 03/01/2014   CREATININE 1.0 03/01/2014   BUN 16 03/01/2014   CO2 27 03/01/2014   TSH 3.84 03/01/2014  Assessment & Plan:   Problem List Items Addressed This Visit    Recurrent sinusitis - Primary    New problem (to me). Likely viral in origin. No evidence of bacterial infection at this time. Treating with prednisone and sending to ENT given recurrent sinusitis.      Relevant Medications   predniSONE (DELTASONE) 50 MG tablet   Other Relevant Orders   Ambulatory referral to ENT      Meds ordered this encounter  Medications  . predniSONE (DELTASONE) 50 MG tablet    Sig: 1 tablet daily x 5 days.    Dispense:  5 tablet    Refill:  0   Follow-up: PRN  [place]

## 2015-09-16 NOTE — Assessment & Plan Note (Signed)
New problem (to me). Likely viral in origin. No evidence of bacterial infection at this time. Treating with prednisone and sending to ENT given recurrent sinusitis.

## 2015-09-17 ENCOUNTER — Encounter: Payer: Self-pay | Admitting: Nurse Practitioner

## 2015-09-25 ENCOUNTER — Telehealth: Payer: Self-pay

## 2015-09-25 ENCOUNTER — Telehealth: Payer: Self-pay | Admitting: Nurse Practitioner

## 2015-09-25 ENCOUNTER — Other Ambulatory Visit: Payer: Self-pay | Admitting: Nurse Practitioner

## 2015-09-25 DIAGNOSIS — J329 Chronic sinusitis, unspecified: Secondary | ICD-10-CM

## 2015-09-25 MED ORDER — PREDNISONE 50 MG PO TABS: ORAL_TABLET | ORAL | Status: DC

## 2015-09-25 NOTE — Telephone Encounter (Signed)
Patient was seen on 2/7 by Dr. Lacinda Axon, gave prednisone and did referral to ENT. Please advise?

## 2015-09-25 NOTE — Telephone Encounter (Signed)
Patient left message was seen in the office about a week ago was given 5 days prednisone for her sinus infection and per the patient it seemed to work. She is requesting more prednisone for her sinus infection.

## 2015-09-25 NOTE — Telephone Encounter (Signed)
Pt attempted to call back about a message left by Lavella Lemons, notified the pt of Lorane Gell comments

## 2015-09-25 NOTE — Telephone Encounter (Signed)
I sent in a 2nd round to her pharmacy. Last time we can do this. She will have to wait for ENT referral to go through if it recurs.

## 2015-09-25 NOTE — Telephone Encounter (Signed)
Attempted to call patient, she has a VM that is full, unable to leave a message to notify her of the message.

## 2015-09-25 NOTE — Telephone Encounter (Signed)
Notified the pt of Lorane Gell, NP comments, pt verbalized understanding

## 2015-11-06 ENCOUNTER — Other Ambulatory Visit: Payer: BC Managed Care – PPO

## 2015-11-06 ENCOUNTER — Encounter: Payer: Self-pay | Admitting: *Deleted

## 2015-11-06 NOTE — Patient Instructions (Signed)
  Your procedure is scheduled on: 11-13-15 Report to [facility] To find out your arrival time please call [phone] between 1PM - 3PM on 11-12-15  Remember: Instructions that are not followed completely may result in serious medical risk, up to and including death, or upon the discretion of your surgeon and anesthesiologist your surgery may need to be rescheduled.    __X__ 1. Do not eat food or drink liquids after midnight. No gum chewing or hard candies.     __X__ 2. No Alcohol for 24 hours before or after surgery.   ____ 3. Bring all medications with you on the day of surgery if instructed.    ____ 4. Notify your doctor if there is any change in your medical condition     (cold, fever, infections).     Do not wear jewelry, make-up, hairpins, clips or nail polish.  Do not wear lotions, powders, or perfumes. You may wear deodorant.  Do not shave 48 hours prior to surgery. Men may shave face and neck.  Do not bring valuables to the hospital.    [facility] is not responsible for any belongings or valuables.               Contacts, dentures or bridgework may not be worn into surgery.  Leave your suitcase in the car. After surgery it may be brought to your room.  For patients admitted to the hospital, discharge time is determined by your treatment team.   Patients discharged the day of surgery will not be allowed to drive home.   Please read over the following fact sheets that you were given:     ____ Take these medicines the morning of surgery with A SIP OF WATER:    1. NONE  2.   3.   4.  5.  6.  ____ Fleet Enema (as directed)   ____ Use CHG Soap as directed  ____ Use inhalers on the day of surgery  ____ Stop metformin 2 days prior to surgery    ____ Take 1/2 of usual insulin dose the night before surgery and none on the morning of surgery.   ____ Stop Coumadin/Plavix/aspirin-N/A  ____ Stop Anti-inflammatories-NO NSAIDS OR ASA  PRODUCTS-TYLENOL OK TO TAKE   ____ Stop supplements until after surgery.    ____ Bring C-Pap to the hospital.

## 2015-11-12 ENCOUNTER — Ambulatory Visit: Admission: RE | Admit: 2015-11-12 | Payer: BC Managed Care – PPO | Source: Ambulatory Visit | Admitting: Otolaryngology

## 2015-11-12 ENCOUNTER — Encounter: Admission: RE | Payer: Self-pay | Source: Ambulatory Visit

## 2015-11-12 SURGERY — SINUS SURGERY, WITH IMAGING GUIDANCE
Anesthesia: General

## 2015-11-13 ENCOUNTER — Encounter: Payer: Self-pay | Admitting: *Deleted

## 2015-11-13 ENCOUNTER — Ambulatory Visit: Payer: BC Managed Care – PPO | Admitting: Anesthesiology

## 2015-11-13 ENCOUNTER — Ambulatory Visit
Admission: RE | Admit: 2015-11-13 | Discharge: 2015-11-13 | Disposition: A | Discharge status: Home / Self Care | Payer: BC Managed Care – PPO | Source: Ambulatory Visit | Attending: Otolaryngology | Admitting: Otolaryngology

## 2015-11-13 ENCOUNTER — Encounter
Admission: RE | Disposition: A | Discharge status: Home / Self Care | Payer: Self-pay | Source: Ambulatory Visit | Attending: Otolaryngology

## 2015-11-13 DIAGNOSIS — J329 Chronic sinusitis, unspecified: Secondary | ICD-10-CM | POA: Insufficient documentation

## 2015-11-13 DIAGNOSIS — J45909 Unspecified asthma, uncomplicated: Secondary | ICD-10-CM | POA: Diagnosis not present

## 2015-11-13 DIAGNOSIS — J342 Deviated nasal septum: Secondary | ICD-10-CM | POA: Diagnosis not present

## 2015-11-13 DIAGNOSIS — J343 Hypertrophy of nasal turbinates: Secondary | ICD-10-CM | POA: Diagnosis not present

## 2015-11-13 DIAGNOSIS — J3489 Other specified disorders of nose and nasal sinuses: Secondary | ICD-10-CM | POA: Diagnosis not present

## 2015-11-13 HISTORY — PX: NASAL SINUS SURGERY: SHX719

## 2015-11-13 HISTORY — DX: Cardiac murmur, unspecified: R01.1

## 2015-11-13 HISTORY — DX: Other complications of anesthesia, initial encounter: T88.59XA

## 2015-11-13 HISTORY — DX: Cardiac arrhythmia, unspecified: I49.9

## 2015-11-13 HISTORY — DX: Adverse effect of unspecified anesthetic, initial encounter: T41.45XA

## 2015-11-13 HISTORY — PX: SEPTOPLASTY WITH ETHMOIDECTOMY, AND MAXILLARY ANTROSTOMY: SHX6090

## 2015-11-13 HISTORY — PX: TURBINATE REDUCTION: SHX6157

## 2015-11-13 LAB — POCT PREGNANCY, URINE: Preg Test, Ur: NEGATIVE

## 2015-11-13 SURGERY — SINUS SURGERY, ENDOSCOPIC
Anesthesia: General

## 2015-11-13 MED ORDER — LACTATED RINGERS IV SOLN
INTRAVENOUS | Status: DC
  Administered 2015-11-13 (×2): via INTRAVENOUS

## 2015-11-13 MED ORDER — LIDOCAINE-EPINEPHRINE 1 %-1:100000 IJ SOLN
INTRAMUSCULAR | Status: DC | PRN
  Administered 2015-11-13: 5 mL

## 2015-11-13 MED ORDER — FAMOTIDINE 20 MG PO TABS
20.0000 mg | ORAL_TABLET | Freq: Once | ORAL | Status: AC
  Administered 2015-11-13: 20 mg via ORAL

## 2015-11-13 MED ORDER — DEXAMETHASONE SODIUM PHOSPHATE 10 MG/ML IJ SOLN
INTRAMUSCULAR | Status: DC | PRN
  Administered 2015-11-13: 10 mg via INTRAVENOUS

## 2015-11-13 MED ORDER — LIDOCAINE-EPINEPHRINE 1 %-1:100000 IJ SOLN
INTRAMUSCULAR | Status: AC
  Filled 2015-11-13: qty 1

## 2015-11-13 MED ORDER — PROPOFOL 10 MG/ML IV BOLUS
INTRAVENOUS | Status: DC | PRN
  Administered 2015-11-13: 150 mg via INTRAVENOUS

## 2015-11-13 MED ORDER — PROMETHAZINE HCL 12.5 MG PO TABS: 12.5000 mg | ORAL_TABLET | Freq: Four times a day (QID) | ORAL | Status: DC | PRN

## 2015-11-13 MED ORDER — FENTANYL CITRATE (PF) 100 MCG/2ML IJ SOLN
INTRAMUSCULAR | Status: AC
  Filled 2015-11-13: qty 2

## 2015-11-13 MED ORDER — ONDANSETRON HCL 4 MG/2ML IJ SOLN
4.0000 mg | Freq: Once | INTRAMUSCULAR | Status: AC | PRN
  Administered 2015-11-13: 4 mg via INTRAVENOUS

## 2015-11-13 MED ORDER — FENTANYL CITRATE (PF) 100 MCG/2ML IJ SOLN
25.0000 ug | INTRAMUSCULAR | Status: DC | PRN
  Administered 2015-11-13 (×5): 25 ug via INTRAVENOUS

## 2015-11-13 MED ORDER — ONDANSETRON HCL 4 MG/2ML IJ SOLN
INTRAMUSCULAR | Status: DC | PRN
  Administered 2015-11-13: 4 mg via INTRAVENOUS

## 2015-11-13 MED ORDER — DOCUSATE SODIUM 100 MG PO CAPS: 100.0000 mg | ORAL_CAPSULE | Freq: Two times a day (BID) | ORAL | Status: DC

## 2015-11-13 MED ORDER — AMOXICILLIN-POT CLAVULANATE 875-125 MG PO TABS: 1.0000 | ORAL_TABLET | Freq: Two times a day (BID) | ORAL | Status: AC

## 2015-11-13 MED ORDER — BACITRACIN ZINC 500 UNIT/GM EX OINT
TOPICAL_OINTMENT | CUTANEOUS | Status: AC
  Filled 2015-11-13: qty 28.35

## 2015-11-13 MED ORDER — GLYCOPYRROLATE 0.2 MG/ML IJ SOLN
INTRAMUSCULAR | Status: DC | PRN
  Administered 2015-11-13: 0.6 mg via INTRAVENOUS

## 2015-11-13 MED ORDER — FENTANYL CITRATE (PF) 250 MCG/5ML IJ SOLN
INTRAMUSCULAR | Status: DC | PRN
  Administered 2015-11-13 (×2): 50 ug via INTRAVENOUS

## 2015-11-13 MED ORDER — LIDOCAINE HCL (CARDIAC) 20 MG/ML IV SOLN
INTRAVENOUS | Status: DC | PRN
Start: 2015-11-13 — End: 2015-11-13
  Administered 2015-11-13: 80 mg via INTRAVENOUS

## 2015-11-13 MED ORDER — MIDAZOLAM HCL 5 MG/5ML IJ SOLN
INTRAMUSCULAR | Status: DC | PRN
  Administered 2015-11-13: 2 mg via INTRAVENOUS

## 2015-11-13 MED ORDER — OXYMETAZOLINE HCL 0.05 % NA SOLN
NASAL | Status: AC
  Filled 2015-11-13: qty 30

## 2015-11-13 MED ORDER — OXYCODONE-ACETAMINOPHEN 5-325 MG PO TABS: 1.0000 | ORAL_TABLET | ORAL | Status: DC | PRN

## 2015-11-13 MED ORDER — ROCURONIUM BROMIDE 100 MG/10ML IV SOLN
INTRAVENOUS | Status: DC | PRN
  Administered 2015-11-13: 40 mg via INTRAVENOUS

## 2015-11-13 MED ORDER — OXYMETAZOLINE HCL 0.05 % NA SOLN
NASAL | Status: DC | PRN
  Administered 2015-11-13: 1 via TOPICAL

## 2015-11-13 MED ORDER — NEOSTIGMINE METHYLSULFATE 10 MG/10ML IV SOLN
INTRAVENOUS | Status: DC | PRN
  Administered 2015-11-13: 4 mg via INTRAVENOUS

## 2015-11-13 MED ORDER — ONDANSETRON HCL 4 MG/2ML IJ SOLN
INTRAMUSCULAR | Status: AC
  Filled 2015-11-13: qty 2

## 2015-11-13 SURGICAL SUPPLY — 39 items
BATTERY INSTRU NAVIGATION (MISCELLANEOUS) ×9 IMPLANT
CANISTER SUC SOCK COL 7IN (MISCELLANEOUS) ×3 IMPLANT
CANISTER SUCT 1200ML W/VALVE (MISCELLANEOUS) ×3 IMPLANT
CANISTER SUCT 3000ML (MISCELLANEOUS) ×3 IMPLANT
CNTNR SPEC 2.5X3XGRAD LEK (MISCELLANEOUS) ×4
COAG SUCT 10F 3.5MM HAND CTRL (MISCELLANEOUS) ×3 IMPLANT
CONT SPEC 4OZ STER OR WHT (MISCELLANEOUS) ×2
CONTAINER SPEC 2.5X3XGRAD LEK (MISCELLANEOUS) ×4 IMPLANT
DEVICE INFLATION 20/61 (MISCELLANEOUS) ×3 IMPLANT
DRESSING NASL FOAM PST OP SINU (MISCELLANEOUS) ×2 IMPLANT
DRSG NASAL 4CM NASOPORE (MISCELLANEOUS) ×3 IMPLANT
DRSG NASAL FOAM POST OP SINU (MISCELLANEOUS) ×3
ELECT REM PT RETURN 9FT ADLT (ELECTROSURGICAL) ×3
ELECTRODE REM PT RTRN 9FT ADLT (ELECTROSURGICAL) ×2 IMPLANT
GAUZE PACK 2X3YD (MISCELLANEOUS) ×3 IMPLANT
GLOVE BIO SURGEON STRL SZ7.5 (GLOVE) ×9 IMPLANT
GLOVE EXAM NITRILE PF MED BLUE (GLOVE) ×6 IMPLANT
GOWN STRL REUS W/ TWL LRG LVL3 (GOWN DISPOSABLE) ×4 IMPLANT
GOWN STRL REUS W/TWL LRG LVL3 (GOWN DISPOSABLE) ×2
IRRIGATOR 4MM STR (IRRIGATION / IRRIGATOR) ×3 IMPLANT
IV NS 1000ML (IV SOLUTION) ×1
IV NS 1000ML BAXH (IV SOLUTION) ×2 IMPLANT
KIT RM TURNOVER STRD PROC AR (KITS) ×3 IMPLANT
NAVIGATION MASK REG  ST (MISCELLANEOUS) ×3 IMPLANT
NS IRRIG 500ML POUR BTL (IV SOLUTION) ×3 IMPLANT
PACK HEAD/NECK (MISCELLANEOUS) ×3 IMPLANT
PATTIES SURGICAL .5 X3 (DISPOSABLE) ×6 IMPLANT
SET HANDPIECE IRR DIEGO (MISCELLANEOUS) ×3 IMPLANT
SOL ANTI-FOG 6CC FOG-OUT (MISCELLANEOUS) ×2 IMPLANT
SOL FOG-OUT ANTI-FOG 6CC (MISCELLANEOUS) ×1
SPLINT NASAL REUTER .5MM BIVLV (MISCELLANEOUS) ×3 IMPLANT
SUT CHROMIC 4 0 RB 1X27 (SUTURE) ×3 IMPLANT
SUT ETHILON 3 0 PS 1 (SUTURE) ×3 IMPLANT
SWAB CULTURE AMIES ANAERIB BLU (MISCELLANEOUS) IMPLANT
SYR 30ML LL (SYRINGE) ×3 IMPLANT
SYSTEM BALLN SINUPLASTY 6X16 (BALLOONS) ×3 IMPLANT
TRAP SPECIMEN MUCOUS 40CC (MISCELLANEOUS) ×3 IMPLANT
TUBING CONNECTING 10 (TUBING) ×3 IMPLANT
WATER STERILE IRR 1000ML POUR (IV SOLUTION) ×3 IMPLANT

## 2015-11-13 NOTE — Discharge Instructions (Signed)
AMBULATORY SURGERY  °DISCHARGE INSTRUCTIONS ° ° °1) The drugs that you were given will stay in your system until tomorrow so for the next 24 hours you should not: ° °A) Drive an automobile °B) Make any legal decisions °C) Drink any alcoholic beverage ° ° °2) You may resume regular meals tomorrow.  Today it is better to start with liquids and gradually work up to solid foods. ° °You may eat anything you prefer, but it is better to start with liquids, then soup and crackers, and gradually work up to solid foods. ° ° °3) Please notify your doctor immediately if you have any unusual bleeding, trouble breathing, redness and pain at the surgery site, drainage, fever, or pain not relieved by medication. ° ° ° °4) Additional Instructions: ° ° ° ° ° ° ° °Please contact your physician with any problems or Same Day Surgery at [phone], Monday through Friday 6 am to 4 pm, or [facility] Main number at [phone]. °

## 2015-11-13 NOTE — Anesthesia Preprocedure Evaluation (Signed)
Anesthesia Evaluation  Patient identified by MRN, date of birth, ID band Patient awake    Reviewed: Allergy & Precautions, NPO status   History of Anesthesia Complications Negative for: history of anesthetic complications  Airway Mallampati: I       Dental  (+) Teeth Intact   Pulmonary asthma ,    Pulmonary exam normal        Cardiovascular  Rhythm:Regular     Neuro/Psych negative neurological ROS     GI/Hepatic negative GI ROS, Neg liver ROS,   Endo/Other  negative endocrine ROS  Renal/GU negative Renal ROS     Musculoskeletal   Abdominal Normal abdominal exam  (+)   Peds negative pediatric ROS (+)  Hematology negative hematology ROS (+)   Anesthesia Other Findings   Reproductive/Obstetrics                             Anesthesia Physical Anesthesia Plan  ASA: II  Anesthesia Plan: General   Post-op Pain Management:    Induction: Intravenous  Airway Management Planned: Oral ETT  Additional Equipment:   Intra-op Plan:   Post-operative Plan: Extubation in OR  Informed Consent: I have reviewed the patients History and Physical, chart, labs and discussed the procedure including the risks, benefits and alternatives for the proposed anesthesia with the patient or authorized representative who has indicated his/her understanding and acceptance.     Plan Discussed with: CRNA  Anesthesia Plan Comments:         Anesthesia Quick Evaluation

## 2015-11-13 NOTE — Transfer of Care (Signed)
Immediate Anesthesia Transfer of Care Note  Patient: Lori Blackwell  Procedure(s) Performed: Procedure(s) with comments: ENDOSCOPIC SINUS SURGERY (N/A) SEPTOPLASTY WITH ETHMOIDECTOMY, AND MAXILLARY ANTROSTOMY (Bilateral) - bilateral maxillary antrostomy, left anterior ethmoidectomy, left frontal sinusotomy TURBINATE REDUCTION (Bilateral)  Patient Location: PACU  Anesthesia Type:General  Level of Consciousness: awake, alert  and oriented  Airway & Oxygen Therapy: Patient Spontanous Breathing and Patient connected to face mask oxygen  Post-op Assessment: Report given to RN and Post -op Vital signs reviewed and stable  Post vital signs: Reviewed and stable  Last Vitals: 1232 - 99% sat 106 hr 14 resp 131/58 bp  Filed Vitals:   11/13/15 0821  BP: 109/67  Pulse: 81  Temp: 36.3 C  Resp: 16    Complications: No apparent anesthesia complications

## 2015-11-13 NOTE — H&P (Signed)
..  History and Physical paper copy reviewed and updated date of procedure and will be scanned into system.  

## 2015-11-13 NOTE — Anesthesia Postprocedure Evaluation (Signed)
Anesthesia Post Note  Patient: Lori Blackwell  Procedure(s) Performed: Procedure(s) (LRB): ENDOSCOPIC SINUS SURGERY (N/A) SEPTOPLASTY WITH ETHMOIDECTOMY, AND MAXILLARY ANTROSTOMY (Bilateral) TURBINATE REDUCTION (Bilateral)  Patient location during evaluation: PACU Anesthesia Type: General Level of consciousness: awake Pain management: pain level controlled Vital Signs Assessment: post-procedure vital signs reviewed and stable Respiratory status: spontaneous breathing Cardiovascular status: blood pressure returned to baseline Anesthetic complications: no    Last Vitals:  Filed Vitals:   11/13/15 1246 11/13/15 1300  BP: 125/67 122/67  Pulse: 77 70  Temp:    Resp: 11 17    Last Pain:  Filed Vitals:   11/13/15 1305  PainSc: Asleep                 VAN STAVEREN,Tayley Mudrick

## 2015-11-13 NOTE — Op Note (Signed)
..11/13/2015  12:20 PM    [name]  [id number]    Pre-Op Dx:  Deviated Nasal Septum, Hypertrophic Inferior Turbinates, Chronic Rhinosinusitis, Nasal obstruction, Chronic maxillary sinusitis, chronic frontal, chronic ethmoid sinusitis  Post-op Dx: Same  Proc:   1)  Bilateral maxillary antrostomy  2)  Left anterior Ethmoidectomy  3)  Left Frontal Sinusotomy  4)  Septoplasty  5)  Bilateral Inferior Turbinate reduction via resection of tissue  6)  Image Guided Sinus Surgery  Surg:  Kiyoko Mcguirt  Anes:  GOT  EBL:  75  Comp:  none  Findings: Left sided septal deviation with impaction on turbinate, bilateral inferior turbinate hypertrophy, Left maxillary sinus with mucus and accessory ostia, right maxillary sinus with polypoid degeneration of mucosa, left anterior ethmoid and left frontal sinus successfully opened.  Procedure: With the patient in a comfortable supine position,  general orotracheal anesthesia was induced without difficulty.  The patient received preoperative Afrin spray for topical decongestion and vasoconstriction.  At an appropriate level, the patient was placed in a semi-sitting position.  Nasal vibrissae were trimmed.   1% Xylocaine with 1:100,000 epinephrine, 5 cc's, was infiltrated into the anterior floor of the nose, into the nasal spine region, into the membranous columella, and finally into the submucoperichondrial plane of the septum on both sides.  Several minutes were allowed for this to take effect.  Cottoniod pledgetts soaked in Afrin were placed into both nasal cavities and left while the patient was prepped and draped in the standard fashion.   A proper time-out was performed.  The Stryker image guidance system was set up and calibrated in the normal fashion with an acceptable error of 0.4 mm.   The materials were removed from the nose and observed to be intact and correct in number.  The nose was inspected with a headlight and zero degree  endoscope with the findings as described above.  A left Killian incision was sharply executed and carried down to the caudal edge of the quadrangular cartilage with a 15 blade scapel.  A mucoperichondrial flap was elelvated along the quadrangular plate back to the bony-cartilaginous junction using caudal elevator and freer elevator. The mucoperiostium was then elevated along the ethmoid plate and the vomer. An itracartilagenous incision was made using the freer elevator and a contralateral mucoperichondiral flap was elevated using a freer elevator.  Care was taken to avoid any large rents or opposing rents in the mucoperichondrial flap.  Boney spurs of the vomer and maxillary crest were removed with Takahashi forceps.  The area of cartilagenous deviation was removed with combination of freer elevator and Takahashi forceps creating a widely patent nasal cavity as well as resolution of obstruction from the cartilagenous deviation. The mucosal flaps were placed back into their anatomic position to allow visualization of the airways. The septum now sat in the midline with an improved airway.  A 4-0 Chromic was used to close the [place] incision as well.   The inferior turbinates were then inspected.  Under endoscopic visualization, the inferior turbinates were infractured bilaterally with a Soil scientist.  A kelly clamp was attached to the anterior-inferior third of each inferior turbinate for approximately one minute.  Under endoscopic visualization, Tru-cutting forceps were used to remove the anterior-inferior third of each inferior turbinate.  Electrocautery was used to control bleeding in the area. The remaining turbinate was then outfractured to open up the airway further. There was no significant bleeding noted. The right turbinate was then trimmed and outfractured in a similar  fashion.  The airways were then visualized and showed open passageways on both sides that were significantly improved compared  to before surgery.       At this point, attention was directed to the functional endoscopic sinus surgery aspect of the procedure.  The nose was next inspected with a zero degree endoscope and the middle turbinates were medialized and afrin soaked pledgets were placed lateral to the turbinates for approximately one minute.  The uncinate process was infractured with a maxillary ostia seeker.  At this time attention was directed to the patient's maxillary sinuses.  On the left, a ball tipped probe was placed through the natural ostia and this was used to create a larger opening.  Using a Diego microdebrider, the maxillary antrostomy was enlarged for a widely patent maxillary antrostomy.  This was repeated in a simlar fashion on the patient's right side.  Hemostasis was performed with topical Afrin soaked pledgets.  Visualization with a 30 degree endoscope was used to examine the bilateral maxillary antrostomies which were noted to be widely patent and in continuity with the natural os bilaterally.  Sinus findings were noted above.  Next attention was directed to the patient's left ethmoid sinus.  The left ethmoid bulla was entered with a straight image guidance suction.  The ethmoid cells were opened from a medial and inferior position superior and laterally until the vertical lamella was encountered.  All fragments of bone and mucosa were removed with straight biting forceps.  The skull base was identified and trauma was avoided.  Image guidance was used throughout to ensure all diseased air cells were opened.  The Acclarent balloon sinuplasty device was next placed behind the uncinate process and the guide wire was threaded into the frontal sinus with proper transillumination.  The sinus tract was then dilated 3X for a patent frontal sinus.  At this time a currette and Giraffe probe was used to remove the fractured ager nasi cells for a widely patient frontal sinus outflow tract.  This was evaluated with 0  degree endoscope and fragements of residual ager nasi cells were removed with 45 degree up-biting forceps.  At this time with all diseased sinuses opened, the patient's nasal cavity was examinated and copiously irrigated with sterile saline.  Meticulous hemostasis was continued and all sinuses were examined and noted to be widely patent.    Xerogel was placed lateral to the middle turbinates bilaterally and inflated with sterile saline.  There was no signifcant bleeding. Nasal splints were applied to both sides of the septum using Xomed 0.70mm regular sized splints that were trimmed, and then held in position with a 3-0 Nylon through and through suture.  Stamberger sinufoam was placed along the cut edge of the inferior turbinates bilaterally.  The patient was turned back over to anesthesia, and awakened, extubated, and taken to the PACU in satisfactory condition.  Dispo:   PACU to home  Plan: Ice, elevation, narcotic analgesia, steroid taper, and prophylactic antibiotics for the duration of indwelling nasal foreign bodies.  We will reevaluate the patient in the office in 6 days and remove the septal splints.  Return to work in 10 days, strenuous activities in two weeks.   Lori Blackwell 11/13/2015 12:20 PM

## 2015-11-13 NOTE — Anesthesia Procedure Notes (Signed)
Procedure Name: Intubation Date/Time: 11/13/2015 10:28 AM Performed by: Delaney Meigs Pre-anesthesia Checklist: Patient identified, Emergency Drugs available, Suction available, Patient being monitored and Timeout performed Patient Re-evaluated:Patient Re-evaluated prior to inductionOxygen Delivery Method: Circle system utilized Preoxygenation: Pre-oxygenation with 100% oxygen Intubation Type: IV induction Ventilation: Mask ventilation without difficulty Laryngoscope Size: Mac and 3 Grade View: Grade I Tube type: Oral Rae Tube size: 7.0 mm Number of attempts: 1 Airway Equipment and Method: Stylet Placement Confirmation: ETT inserted through vocal cords under direct vision,  positive ETCO2 and breath sounds checked- equal and bilateral Secured at: 22 cm Tube secured with: Tape Dental Injury: Teeth and Oropharynx as per pre-operative assessment

## 2015-11-17 LAB — SURGICAL PATHOLOGY

## 2015-12-05 ENCOUNTER — Encounter: Payer: Self-pay | Admitting: Nurse Practitioner

## 2015-12-05 DIAGNOSIS — IMO0002 Reserved for concepts with insufficient information to code with codable children: Secondary | ICD-10-CM | POA: Insufficient documentation

## 2015-12-16 ENCOUNTER — Ambulatory Visit (INDEPENDENT_AMBULATORY_CARE_PROVIDER_SITE_OTHER): Payer: BC Managed Care – PPO | Admitting: Family Medicine

## 2015-12-16 ENCOUNTER — Encounter: Payer: Self-pay | Admitting: Family Medicine

## 2015-12-16 VITALS — BP 104/68 | HR 69 | Temp 98.6°F | Ht 67.0 in | Wt 149.8 lb

## 2015-12-16 DIAGNOSIS — J069 Acute upper respiratory infection, unspecified: Secondary | ICD-10-CM

## 2015-12-16 MED ORDER — AMOXICILLIN-POT CLAVULANATE 875-125 MG PO TABS: 1.0000 | ORAL_TABLET | Freq: Two times a day (BID) | ORAL | Status: DC

## 2015-12-16 MED ORDER — PREDNISONE 20 MG PO TABS: 40.0000 mg | ORAL_TABLET | Freq: Every day | ORAL | Status: DC

## 2015-12-16 MED ORDER — HYDROCODONE-HOMATROPINE 5-1.5 MG/5ML PO SYRP: 5.0000 mL | ORAL_SOLUTION | Freq: Three times a day (TID) | ORAL | Status: DC | PRN

## 2015-12-16 MED ORDER — ALBUTEROL SULFATE HFA 108 (90 BASE) MCG/ACT IN AERS: 2.0000 | INHALATION_SPRAY | Freq: Four times a day (QID) | RESPIRATORY_TRACT | Status: DC | PRN

## 2015-12-16 NOTE — Progress Notes (Signed)
Pre visit review using our clinic review tool, if applicable. No additional management support is needed unless otherwise documented below in the visit note. 

## 2015-12-16 NOTE — Patient Instructions (Signed)
Nice to meet you. Your symptoms are likely related to a virus or allergies. Could have bronchitis as well. We will treat you with prednisone. Even use Hycodan for cough. If her symptoms are not improving in the next 2 days you can fill the Augmentin. If you develop new or changing symptoms you should be seen prior to filling the Augmentin. If you develop shortness of breath, numbness, weakness, vision changes, fevers, worsening headache, change in her headache, or any new or changing symptoms please seek medical attention.

## 2015-12-16 NOTE — Assessment & Plan Note (Addendum)
Patient's symptoms most likely related to viral upper respiratory infection though could also be related to allergic rhinitis. Given short duration less likely related to bacterial illness. She has had recurrent sinus infections and recently had surgery for this. No drainage from her nose on standing from a laying or seated position. She is neurologically intact. Suspect headaches are likely related to her sinus process. We will treat with prednisone. We will provide an albuterol inhaler to cover for possible bronchitis component. Hycodan for cough. She was given a prescription for Augmentin to take if she does not improve in the next several days. She was advised that if she were to worsen or develop any new symptoms she should be evaluated prior to filling antibiotic. She's given return precautions.

## 2015-12-16 NOTE — Progress Notes (Signed)
Patient ID: Lori Blackwell, female   DOB: Jul 13, 1991, 25 y.o.   MRN: [id number]  Tommi Rumps, MD Phone: [phone]  Lori Blackwell is a 25 y.o. female who presents today for same-day visit.  Patient notes onset of symptoms Friday of last week. Started with cough and sinus congestion and pressure. More cough when lying down. She then developed some headaches over bitemporal that worsened with standing though resolved quickly after several seconds. She is congested. No rhinorrhea. No numbness or weakness or vision changes. No fevers. She is blowing green chunky mucus out of her nose. She had sinus surgery about a month ago and had 10 days of Augmentin following that. She does note maybe some wheezing while at the gym though no chest pain or shortness of breath. She has been using an allergy pill and nasal spray.  PMH: nonsmoker.   ROS see history of present illness  Objective  Physical Exam Filed Vitals:   12/16/15 1452  BP: 104/68  Pulse: 69  Temp: 98.6 F (37 C)    BP Readings from Last 3 Encounters:  12/16/15 104/68  11/13/15 113/63  09/16/15 108/62   Wt Readings from Last 3 Encounters:  12/16/15 149 lb 12.8 oz (67.949 kg)  11/13/15 147 lb (66.679 kg)  09/16/15 148 lb 6 oz (67.302 kg)    Physical Exam  Constitutional: She is well-developed, well-nourished, and in no distress.  HENT:  Head: Normocephalic and atraumatic.  Right Ear: External ear normal.  Left Ear: External ear normal.  Mouth/Throat: Oropharynx is clear and moist. No oropharyngeal exudate.  Normal TMs bilaterally  Eyes: Conjunctivae are normal. Pupils are equal, round, and reactive to light.  Neck: Neck supple.  Cardiovascular: Normal rate and normal heart sounds.   Pulmonary/Chest: Effort normal and breath sounds normal.  Lymphadenopathy:    She has no cervical adenopathy.  Neurological: She is alert. Gait normal.  CN 2-12 intact, 5/5 strength in bilateral biceps, triceps, grip, quads,  hamstrings, plantar and dorsiflexion, sensation to light touch intact in bilateral UE and LE, normal gait, 2+ patellar reflexes  Skin: Skin is warm and dry. She is not diaphoretic.     Assessment/Plan: Please see individual problem list.  Acute upper respiratory infection Patient's symptoms most likely related to viral upper respiratory infection though could also be related to allergic rhinitis. Given short duration less likely related to bacterial illness. She has had recurrent sinus infections and recently had surgery for this. No drainage from her nose on standing from a laying or seated position. She is neurologically intact. Suspect headaches are likely related to her sinus process. We will treat with prednisone. We will provide an albuterol inhaler to cover for possible bronchitis component. Hycodan for cough. She was given a prescription for Augmentin to take if she does not improve in the next several days. She was advised that if she were to worsen or develop any new symptoms she should be evaluated prior to filling antibiotic. She's given return precautions.    No orders of the defined types were placed in this encounter.    Meds ordered this encounter  Medications  . predniSONE (DELTASONE) 20 MG tablet    Sig: Take 2 tablets (40 mg total) by mouth daily with breakfast.    Dispense:  10 tablet    Refill:  0  . amoxicillin-clavulanate (AUGMENTIN) 875-125 MG tablet    Sig: Take 1 tablet by mouth 2 (two) times daily.    Dispense:  14 tablet  Refill:  0  . albuterol (PROVENTIL HFA;VENTOLIN HFA) 108 (90 Base) MCG/ACT inhaler    Sig: Inhale 2 puffs into the lungs every 6 (six) hours as needed for wheezing or shortness of breath.    Dispense:  1 Inhaler    Refill:  0  . HYDROcodone-homatropine (HYCODAN) 5-1.5 MG/5ML syrup    Sig: Take 5 mLs by mouth every 8 (eight) hours as needed for cough.    Dispense:  120 mL    Refill:  0    Tommi Rumps, MD [place]

## 2016-08-13 ENCOUNTER — Telehealth: Payer: Self-pay | Admitting: Family Medicine

## 2016-08-13 MED ORDER — EPINEPHRINE 0.3 MG/0.3ML IJ SOAJ: 0.3000 mg | Freq: Once | INTRAMUSCULAR | 0 refills | Status: DC | PRN

## 2016-08-13 NOTE — Telephone Encounter (Signed)
Pt called wanting to get a refill on her Epipen pt stated it had expired. Pt also would like 2. Thank you!  Pharmacy is [facility] - [place], [address] AT [address]  Call pt @ [phone].

## 2016-08-13 NOTE — Telephone Encounter (Signed)
Please advise 

## 2016-08-13 NOTE — Telephone Encounter (Signed)
noted 

## 2016-08-13 NOTE — Telephone Encounter (Signed)
Sent to pharmacy 

## 2016-09-16 ENCOUNTER — Ambulatory Visit (INDEPENDENT_AMBULATORY_CARE_PROVIDER_SITE_OTHER): Payer: BC Managed Care – PPO | Admitting: Adult Health

## 2016-09-16 ENCOUNTER — Telehealth: Payer: Self-pay | Admitting: Family Medicine

## 2016-09-16 ENCOUNTER — Encounter: Payer: Self-pay | Admitting: Family

## 2016-09-16 VITALS — BP 112/68 | Temp 97.6°F | Ht 67.0 in | Wt 147.4 lb

## 2016-09-16 DIAGNOSIS — J0100 Acute maxillary sinusitis, unspecified: Secondary | ICD-10-CM | POA: Diagnosis not present

## 2016-09-16 MED ORDER — AMOXICILLIN 875 MG PO TABS: 875.0000 mg | ORAL_TABLET | Freq: Two times a day (BID) | ORAL | 0 refills | Status: DC

## 2016-09-16 NOTE — Telephone Encounter (Signed)
Ok with me 

## 2016-09-16 NOTE — Telephone Encounter (Signed)
This switch is ok with me

## 2016-09-16 NOTE — Progress Notes (Signed)
Subjective:    Patient ID: Lori Blackwell, female    DOB: 08-30-1990, 26 y.o.   MRN: [id number]  Had sinus surgery last April, which she reports has helped with her symptoms.    Sinusitis  This is a recurrent problem. The current episode started 1 to 4 weeks ago (10 days). The problem is unchanged. There has been no fever. Associated symptoms include congestion, coughing (dry ), ear pain (L>R), headaches and sinus pressure. Past treatments include saline sprays. The treatment provided mild relief.      Review of Systems  Constitutional: Positive for fatigue. Negative for appetite change and fever.  HENT: Positive for congestion, ear pain (L>R), postnasal drip, rhinorrhea, sinus pain and sinus pressure.   Respiratory: Positive for cough (dry ).   Cardiovascular: Negative.   Gastrointestinal: Negative.   Skin: Negative.   Neurological: Positive for headaches.  All other systems reviewed and are negative.  Past Medical History:  Diagnosis Date  . Allergy   . Asthma    SEASONAL   . Bronchitis   . Chicken pox   . Complication of anesthesia    HARD TIME WAKING UP  . Dysrhythmia    H/O IN COLLEGE-HAD EKG WHICH PT STATES WAS NORMAL-NO PROBLEMS SINCE  . Heart murmur    BORN WITH HEART MURMUR-OUTGREW-ASYMPTOMATIC    Social History   Social History  . Marital status: Single    Spouse name: N/A  . Number of children: 0  . Years of education: 5   Occupational History  . Kindergarten Mining engineer [employer]    [employer]   Social History Main Topics  . Smoking status: Never Smoker  . Smokeless tobacco: Never Used  . Alcohol use Yes     Comment: 1-3 drinks weekly  . Drug use: No  . Sexual activity: Not on file   Other Topics Concern  . Not on file   Social History Narrative   [name] grew up in [place]. 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 She is working as a Oncologist at [employer]. She lives in [place].      Hobbies: Running, paint with acrylics   Exercise: Regularly attends [employer] and does weights, interval training, cardio   Diet: Follows clean eating and allows herself 3 cheat meals weekly.    Past Surgical History:  Procedure Laterality Date  . APPENDECTOMY  2001  . NASAL SINUS SURGERY N/A 11/13/2015   Procedure: ENDOSCOPIC SINUS SURGERY;  Surgeon: Carloyn Manner, MD;  Location: [facility] ORS;  Service: ENT;  Laterality: N/A;  . SEPTOPLASTY WITH ETHMOIDECTOMY, AND MAXILLARY ANTROSTOMY Bilateral 11/13/2015   Procedure: SEPTOPLASTY WITH ETHMOIDECTOMY, AND MAXILLARY ANTROSTOMY;  Surgeon: Carloyn Manner, MD;  Location: [facility] ORS;  Service: ENT;  Laterality: Bilateral;  bilateral maxillary antrostomy, left anterior ethmoidectomy, left frontal sinusotomy  . SHOULDER SURGERY Right 2008   2011  . TURBINATE REDUCTION Bilateral 11/13/2015   Procedure: TURBINATE REDUCTION;  Surgeon: Carloyn Manner, MD;  Location: [facility] ORS;  Service: ENT;  Laterality: Bilateral;  . WISDOM TOOTH EXTRACTION      Family History  Problem Relation Age of Onset  . Hyperlipidemia Mother   . Cancer Maternal Aunt     breast cancer  . Cancer Maternal Grandmother     breast cancer    Allergies  Allergen Reactions  . Shellfish Allergy Anaphylaxis  . Sulfa Antibiotics Rash    Current Outpatient Prescriptions on File Prior to Visit  Medication  Sig Dispense Refill  . albuterol (PROVENTIL HFA;VENTOLIN HFA) 108 (90 Base) MCG/ACT inhaler Inhale 2 puffs into the lungs every 6 (six) hours as needed for wheezing or shortness of breath. 1 Inhaler 0  . Biotin 1 MG CAPS Take 1 tablet by mouth daily.    Marland Kitchen docusate sodium (COLACE) 100 MG capsule Take 1 capsule (100 mg total) by mouth 2 (two) times daily. 40 capsule 0  . EPINEPHrine 0.3 mg/0.3 mL IJ SOAJ injection Inject 0.3 mLs (0.3 mg total) into the muscle once as needed. For anaphylactic reaction. 2 Device 0  . HYDROcodone-homatropine  (HYCODAN) 5-1.5 MG/5ML syrup Take 5 mLs by mouth every 8 (eight) hours as needed for cough. 120 mL 0  . Multiple Vitamin (MULTIVITAMIN) tablet Take 1 tablet by mouth daily.    . norgestimate-ethinyl estradiol (MONONESSA) 0.25-35 MG-MCG tablet Take 1 tablet by mouth daily.    Marland Kitchen oxyCODONE-acetaminophen (ROXICET) 5-325 MG tablet Take 1-2 tablets by mouth every 4 (four) hours as needed for severe pain. 50 tablet 0  . predniSONE (DELTASONE) 20 MG tablet Take 2 tablets (40 mg total) by mouth daily with breakfast. 10 tablet 0  . promethazine (PHENERGAN) 12.5 MG tablet Take 1 tablet (12.5 mg total) by mouth every 6 (six) hours as needed for nausea or vomiting. 30 tablet 0   No current facility-administered medications on file prior to visit.     BP 112/68   Temp 97.6 F (36.4 C) (Oral)   Ht 5\' 7"  (1.702 m)   Wt 147 lb 6.4 oz (66.9 kg)   BMI 23.09 kg/m       Objective:   Physical Exam  Constitutional: She is oriented to person, place, and time. She appears well-developed and well-nourished. No distress.  HENT:  Head: Normocephalic and atraumatic.  Right Ear: Hearing, tympanic membrane, external ear and ear canal normal.  Left Ear: Hearing, tympanic membrane, external ear and ear canal normal.  Nose: Mucosal edema and rhinorrhea present. Right sinus exhibits maxillary sinus tenderness and frontal sinus tenderness. Left sinus exhibits maxillary sinus tenderness and frontal sinus tenderness.  Mouth/Throat: Uvula is midline, oropharynx is clear and moist and mucous membranes are normal. No oropharyngeal exudate.  Cardiovascular: Normal rate, regular rhythm, normal heart sounds and intact distal pulses.  Exam reveals no gallop and no friction rub.   No murmur heard. Pulmonary/Chest: Effort normal and breath sounds normal. No respiratory distress. She has no wheezes. She has no rales. She exhibits no tenderness.  Neurological: She is alert and oriented to person, place, and time.  Skin: Skin is  warm and dry. No rash noted. She is not diaphoretic. No erythema. No pallor.  Psychiatric: She has a normal mood and affect. Her behavior is normal. Judgment and thought content normal.  Nursing note and vitals reviewed.     Assessment & Plan:  1. Acute non-recurrent maxillary sinusitis - amoxicillin (AMOXIL) 875 MG tablet; Take 1 tablet (875 mg total) by mouth 2 (two) times daily.  Dispense: 14 tablet; Refill: 0 - Continue with flonase and nasal wash.   Dorothyann Peng, AGNP

## 2016-09-16 NOTE — Telephone Encounter (Signed)
Patient is requesting to transfer from [place] to [facility].  Please advise.

## 2016-09-17 NOTE — Telephone Encounter (Signed)
Patient will call back to the office to schedule appt

## 2016-10-11 ENCOUNTER — Encounter: Payer: Self-pay | Admitting: Family Medicine

## 2016-10-11 ENCOUNTER — Ambulatory Visit (INDEPENDENT_AMBULATORY_CARE_PROVIDER_SITE_OTHER): Payer: BC Managed Care – PPO | Admitting: Family Medicine

## 2016-10-11 VITALS — BP 98/72 | HR 64 | Temp 98.3°F | Ht 67.0 in | Wt 148.2 lb

## 2016-10-11 DIAGNOSIS — J029 Acute pharyngitis, unspecified: Secondary | ICD-10-CM | POA: Diagnosis not present

## 2016-10-11 LAB — POCT RAPID STREP A (OFFICE): Rapid Strep A Screen: NEGATIVE

## 2016-10-11 NOTE — Patient Instructions (Signed)
Rapid and strep culture were ordered today. The culture can take several days to result.  Afrin nasal spray for 4 days then stop. Can use aleve or tylenol per in instructions as needed.  Start allergy medication if you typically have allergy issues in the spring - zyrtec or allegra.  It was nice to meet you today and I hope you are feeling better soon! Seek care immediately if worsening, new concerns or you are not improving with treatment.  WE NOW OFFER   [facility] FAST TRACK!!!  SAME DAY Appointments for ACUTE CARE  Such as: Sprains, Injuries, cuts, abrasions, rashes, muscle pain, joint pain, back pain Colds, flu, sore throats, headache, allergies, cough, fever  Ear pain, sinus and eye infections Abdominal pain, nausea, vomiting, diarrhea, upset stomach Animal/insect bites  3 Easy Ways to Schedule: Walk-In Scheduling Call in scheduling Mychart Sign-up: [email or web address]

## 2016-10-11 NOTE — Progress Notes (Signed)
Pre visit review using our clinic review tool, if applicable. No additional management support is needed unless otherwise documented below in the visit note. 

## 2016-10-11 NOTE — Progress Notes (Signed)
HPI:  Acute visit for sore throat: -started: yesterday -symptoms: very sore throat, mild allergy symptoms - nasal congestion, PND -denies:fever, SOB, NVD, tooth pain, body aches -has tried: nothing -sick contacts/travel/risks: no reported flu, strep or tick exposure - but teaches kindergarten -Hx of: allergies, asthma - has not needed albuterol recently  ROS: See pertinent positives and negatives per HPI.  Past Medical History:  Diagnosis Date  . Allergy   . Asthma    SEASONAL   . Bronchitis   . Chicken pox   . Complication of anesthesia    HARD TIME WAKING UP  . Dysrhythmia    H/O IN COLLEGE-HAD EKG WHICH PT STATES WAS NORMAL-NO PROBLEMS SINCE  . Heart murmur    BORN WITH HEART MURMUR-OUTGREW-ASYMPTOMATIC    Past Surgical History:  Procedure Laterality Date  . APPENDECTOMY  2001  . NASAL SINUS SURGERY N/A 11/13/2015   Procedure: ENDOSCOPIC SINUS SURGERY;  Surgeon: Carloyn Manner, MD;  Location: [facility];  Service: ENT;  Laterality: N/A;  . SEPTOPLASTY WITH ETHMOIDECTOMY, AND MAXILLARY ANTROSTOMY Bilateral 11/13/2015   Procedure: SEPTOPLASTY WITH ETHMOIDECTOMY, AND MAXILLARY ANTROSTOMY;  Surgeon: Carloyn Manner, MD;  Location: [facility];  Service: ENT;  Laterality: Bilateral;  bilateral maxillary antrostomy, left anterior ethmoidectomy, left frontal sinusotomy  . SHOULDER SURGERY Right 2008   2011  . TURBINATE REDUCTION Bilateral 11/13/2015   Procedure: TURBINATE REDUCTION;  Surgeon: Carloyn Manner, MD;  Location: [facility];  Service: ENT;  Laterality: Bilateral;  . WISDOM TOOTH EXTRACTION      Family History  Problem Relation Age of Onset  . Hyperlipidemia Mother   . Cancer Maternal Aunt     breast cancer  . Cancer Maternal Grandmother     breast cancer    Social History   Social History  . Marital status: Single    Spouse name: N/A  . Number of children: 0  . Years of education: 61   Occupational History  . Kindergarten Mining engineer [employer]    [employer]   Social History Main Topics  . Smoking status: Never Smoker  . Smokeless tobacco: Never Used  . Alcohol use Yes     Comment: 1-3 drinks weekly  . Drug use: No  . Sexual activity: Not Asked   Other Topics Concern  . None   Social History Narrative   Crucita grew up in [place]. 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 She is working as a Oncologist at [employer]. She lives in [place].      Hobbies: Running, paint with acrylics   Exercise: Regularly attends [employer] and does weights, interval training, cardio   Diet: Follows clean eating and allows herself 3 cheat meals weekly.     Current Outpatient Prescriptions:  .  albuterol (PROVENTIL HFA;VENTOLIN HFA) 108 (90 Base) MCG/ACT inhaler, Inhale 2 puffs into the lungs every 6 (six) hours as needed for wheezing or shortness of breath., Disp: 1 Inhaler, Rfl: 0 .  EPINEPHrine 0.3 mg/0.3 mL IJ SOAJ injection, Inject 0.3 mLs (0.3 mg total) into the muscle once as needed. For anaphylactic reaction., Disp: 2 Device, Rfl: 0 .  Multiple Vitamin (MULTIVITAMIN) tablet, Take 1 tablet by mouth daily., Disp: , Rfl:  .  norgestimate-ethinyl estradiol (MONONESSA) 0.25-35 MG-MCG tablet, Take 1 tablet by mouth daily., Disp: , Rfl:   EXAM:  Vitals:   10/11/16 1610  BP: 98/72  Pulse: 64  Temp: 98.3 F (36.8 C)  Body mass index is 23.21 kg/m.  GENERAL: vitals reviewed and listed above, alert, oriented, appears well hydrated and in no acute distress  HEENT: atraumatic, conjunttiva clear, no obvious abnormalities on inspection of external nose and ears, normal appearance of ear canals and TMs, clear nasal congestion, mild post oropharyngeal erythema with PND, no tonsillar edema or exudate, no sinus TTP  NECK: no obvious masses on inspection  LUNGS: clear to auscultation bilaterally, no wheezes, rales or rhonchi, good air movement  CV: HRRR, no  peripheral edema  MS: moves all extremities without noticeable abnormality  PSYCH: pleasant and cooperative, no obvious depression or anxiety  ASSESSMENT AND PLAN:  Discussed the following assessment and plan:  Sore throat - Plan: POC Rapid Strep A, Culture, Group A Strep  -given HPI and exam findings today, a serious infection or illness is unlikely. We discussed potential etiologies, with VURI vs allergies being most likely, and advised supportive care and monitoring for this.  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 Rapid negative. Culture pending. We discussed treatment side effects, likely course, antibiotic misuse, transmission, and signs of developing a serious illness. -of course, we advised to return or notify a doctor immediately if symptoms worsen or persist or new concerns arise.    Patient Instructions  Rapid and strep culture were ordered today. The culture can take several days to result.  Afrin nasal spray for 4 days then stop. Can use aleve or tylenol per in instructions as needed.  Start allergy medication if you typically have allergy issues in the spring - zyrtec or allegra.  It was nice to meet you today and I hope you are feeling better soon! Seek care immediately if worsening, new concerns or you are not improving with treatment.  WE NOW OFFER   [facility] FAST TRACK!!!  SAME DAY Appointments for ACUTE CARE  Such as: Sprains, Injuries, cuts, abrasions, rashes, muscle pain, joint pain, back pain Colds, flu, sore throats, headache, allergies, cough, fever  Ear pain, sinus and eye infections Abdominal pain, nausea, vomiting, diarrhea, upset stomach Animal/insect bites  3 Easy Ways to Schedule: Walk-In Scheduling Call in scheduling Mychart Sign-up: [email or web address]              Colin Benton R., DO

## 2016-10-14 LAB — CULTURE, GROUP A STREP

## 2017-09-01 ENCOUNTER — Ambulatory Visit: Payer: BC Managed Care – PPO | Admitting: Family Medicine

## 2017-09-01 ENCOUNTER — Encounter: Payer: Self-pay | Admitting: Family Medicine

## 2017-09-01 VITALS — BP 104/66 | HR 76 | Temp 98.1°F | Resp 16 | Ht 67.0 in | Wt 147.5 lb

## 2017-09-01 DIAGNOSIS — B029 Zoster without complications: Secondary | ICD-10-CM | POA: Diagnosis not present

## 2017-09-01 MED ORDER — FAMCICLOVIR 500 MG PO TABS: 500.0000 mg | ORAL_TABLET | Freq: Three times a day (TID) | ORAL | 0 refills | Status: DC

## 2017-09-01 NOTE — Progress Notes (Signed)
Pre-visit discussion using our clinic review tool. No additional management support is needed unless otherwise documented below in the visit note.  

## 2017-09-01 NOTE — Progress Notes (Signed)
Patient ID: Lori Blackwell, female   DOB: 03-10-1991, 27 y.o.   MRN: [id number]   PCP: Leone Haven, MD  Subjective:  Lori Blackwell is a 27 y.o. year old very pleasant female patient who presents with a Rash: Initial distribution: left wrist and forearm Prior history of this rash: No Discomfort associated: tender to touch on area of wrist. Associated symptoms:  Mild pruritis, discomfort noted when touching clothing Change in rash: New lesions appearing on forearm today Denies:  Fever, chills, sweats, myalgias, difficulty swallowing, hoarseness, SOB, N/V Abdominal pain, or tightening throat. No new exposures of soaps, lotions, laundry detergent, fabric softeners, foods, medications, herbal supplements, STDs, plants, animals, or insects. LMP ended 2 days ago.  Denies pregnancy; takes oral contraceptives  -started: 3 days ago  , symptoms are not improving -previous treatments: Benedryl spray does not provide benefit. -sick contacts/travel/risks: denies flu exposure.  -Hx of: mild seasonal allergies; recent increase in stress due to work, school, and upcoming wedding  ROS-denies fever, SOB, NVD,   Pertinent Past Medical History- works as a Oncologist, Lexicographer, and going to graduate school  Medications- reviewed  Current Outpatient Medications  Medication Sig Dispense Refill  . albuterol (PROVENTIL HFA;VENTOLIN HFA) 108 (90 Base) MCG/ACT inhaler Inhale 2 puffs into the lungs every 6 (six) hours as needed for wheezing or shortness of breath. 1 Inhaler 0  . EPINEPHrine 0.3 mg/0.3 mL IJ SOAJ injection Inject 0.3 mLs (0.3 mg total) into the muscle once as needed. For anaphylactic reaction. 2 Device 0  . Multiple Vitamin (MULTIVITAMIN) tablet Take 1 tablet by mouth daily.    . norgestimate-ethinyl estradiol (MONONESSA) 0.25-35 MG-MCG tablet Take 1 tablet by mouth daily.     No current facility-administered medications for this visit.     Objective: BP 104/66 (BP  Location: Left Arm, Patient Position: Sitting, Cuff Size: Normal)   Pulse 76   Temp 98.1 F (36.7 C) (Oral)   Resp 16   Ht 5\' 7"  (1.702 m)   Wt 147 lb 8 oz (66.9 kg)   LMP 08/23/2017 (Exact Date)   SpO2 98%   BMI 23.10 kg/m  Gen: NAD, resting comfortably HEENT: Oropharynx is clear and moist CV: RRR no murmurs rubs or gallops Lungs: CTAB no crackles, wheeze, rhonchi Abdomen: soft/nontender/nondistended/normal bowel sounds. No rebound or guarding.  Ext: no edema Skin: warm, dry, Clustered lesions that are vesicular in nature with an erythematous base are noted on forearm and follow a dermatomal pattern. Lesions closer to wrist appear to be drying up. Neuro: grossly normal, moves all extremities  Assessment/Plan: 1. Herpes zoster without complication Vesicular clustered lesions in dermatomal pattern support likely herpes zoster and less likely parasitic in nature. Reviewed clinical manifestations of scabies with history of teaching kindergarten however in absence of itching and lesions noted as tender to touch and vesicular in nature with various degrees of healing are more likely herpes zoster. Provided famciclovir as new lesions appeared today and advised her to monitor symptoms closely and follow up if no improvement or changes in symptoms occur.  - famciclovir (FAMVIR) 500 MG tablet; Take 1 tablet (500 mg total) by mouth 3 (three) times daily.  Dispense: 21 tablet; Refill: 0  Finally, we reviewed reasons to return to care including if symptoms worsen or persist or new concerns arise- once again particularly rash that does not improve,  shortness of breath, N/V, or fever. Reviewed importance of calling 911immediately if symptoms of SOB, difficulty swallowing, or throat tightening.  Laurita Quint, FNP

## 2017-09-01 NOTE — Patient Instructions (Signed)
Please take medication as directed and follow up if symptoms do not improve with treatments, worsen, or you develop new symptoms.   Shingles Shingles, which is also known as herpes zoster, is an infection that causes a painful skin rash and fluid-filled blisters. Shingles is not related to genital herpes, which is a sexually transmitted infection. Shingles only develops in people who:  Have had chickenpox.  Have received the chickenpox vaccine. (This is rare.)  What are the causes? Shingles is caused by varicella-zoster virus (VZV). This is the same virus that causes chickenpox. After exposure to VZV, the virus stays in the body in an inactive (dormant) state. Shingles develops if the virus reactivates. This can happen many years after the initial exposure to VZV. It is not known what causes this virus to reactivate. What increases the risk? People who have had chickenpox or received the chickenpox vaccine are at risk for shingles. Infection is more common in people who:  Are older than age 56.  Have a weakened defense (immune) system, such as those with HIV, AIDS, or cancer.  Are taking medicines that weaken the immune system, such as transplant medicines.  Are under great stress.  What are the signs or symptoms? Early symptoms of this condition include itching, tingling, and pain in an area on your skin. Pain may be described as burning, stabbing, or throbbing. A few days or weeks after symptoms start, a painful red rash appears, usually on one side of the body in a bandlike or beltlike pattern. The rash eventually turns into fluid-filled blisters that break open, scab over, and dry up in about 2-3 weeks. At any time during the infection, you may also develop:  A fever.  Chills.  A headache.  An upset stomach.  How is this diagnosed? This condition is diagnosed with a skin exam. Sometimes, skin or fluid samples are taken from the blisters before a diagnosis is made. These  samples are examined under a microscope or sent to a lab for testing. How is this treated? There is no specific cure for this condition. Your health care provider will probably prescribe medicines to help you manage pain, recover more quickly, and avoid long-term problems. Medicines may include:  Antiviral drugs.  Anti-inflammatory drugs.  Pain medicines.  If the area involved is on your face, you may be referred to a specialist, such as an eye doctor (ophthalmologist) or an ear, nose, and throat (ENT) doctor to help you avoid eye problems, chronic pain, or disability. Follow these instructions at home: Medicines  Take medicines only as directed by your health care provider.  Apply an anti-itch or numbing cream to the affected area as directed by your health care provider. Blister and Rash Care  Take a cool bath or apply cool compresses to the area of the rash or blisters as directed by your health care provider. This may help with pain and itching.  Keep your rash covered with a loose bandage (dressing). Wear loose-fitting clothing to help ease the pain of material rubbing against the rash.  Keep your rash and blisters clean with mild soap and cool water or as directed by your health care provider.  Check your rash every day for signs of infection. These include redness, swelling, and pain that lasts or increases.  Do not pick your blisters.  Do not scratch your rash. General instructions  Rest as directed by your health care provider.  Keep all follow-up visits as directed by your health care provider. This  is important.  Until your blisters scab over, your infection can cause chickenpox in people who have never had it or been vaccinated against it. To prevent this from happening, avoid contact with other people, especially: ? Babies. ? Pregnant women. ? Children who have eczema. ? Elderly people who have transplants. ? People who have chronic illnesses, such as leukemia or  AIDS. Contact a health care provider if:  Your pain is not relieved with prescribed medicines.  Your pain does not get better after the rash heals.  Your rash looks infected. Signs of infection include redness, swelling, and pain that lasts or increases. Get help right away if:  The rash is on your face or nose.  You have facial pain, pain around your eye area, or loss of feeling on one side of your face.  You have ear pain or you have ringing in your ear.  You have loss of taste.  Your condition gets worse. This information is not intended to replace advice given to you by your health care provider. Make sure you discuss any questions you have with your health care provider. Document Released: 07/26/2005 Document Revised: 03/21/2016 Document Reviewed: 06/06/2014 Elsevier Interactive Patient Education  2018 [employer].

## 2017-11-19 ENCOUNTER — Emergency Department (HOSPITAL_COMMUNITY): Payer: BC Managed Care – PPO

## 2017-11-19 ENCOUNTER — Other Ambulatory Visit: Payer: Self-pay

## 2017-11-19 ENCOUNTER — Emergency Department (HOSPITAL_COMMUNITY)
Admission: EM | Admit: 2017-11-19 | Discharge: 2017-11-19 | Disposition: A | Discharge status: Home / Self Care | Payer: BC Managed Care – PPO | Attending: Emergency Medicine | Admitting: Emergency Medicine

## 2017-11-19 DIAGNOSIS — R112 Nausea with vomiting, unspecified: Secondary | ICD-10-CM | POA: Diagnosis not present

## 2017-11-19 DIAGNOSIS — R197 Diarrhea, unspecified: Secondary | ICD-10-CM | POA: Insufficient documentation

## 2017-11-19 DIAGNOSIS — R102 Pelvic and perineal pain: Secondary | ICD-10-CM | POA: Insufficient documentation

## 2017-11-19 LAB — CBC
HCT: 42.4 % (ref 36.0–46.0)
Hemoglobin: 13.9 g/dL (ref 12.0–15.0)
MCH: 30.5 pg (ref 26.0–34.0)
MCHC: 32.8 g/dL (ref 30.0–36.0)
MCV: 93.2 fL (ref 78.0–100.0)
Platelets: 214 10*3/uL (ref 150–400)
RBC: 4.55 MIL/uL (ref 3.87–5.11)
RDW: 13.2 % (ref 11.5–15.5)
WBC: 7.5 10*3/uL (ref 4.0–10.5)

## 2017-11-19 LAB — COMPREHENSIVE METABOLIC PANEL
ALBUMIN: 3.4 g/dL — AB (ref 3.5–5.0)
ALK PHOS: 43 U/L (ref 38–126)
ALT: 20 U/L (ref 14–54)
AST: 24 U/L (ref 15–41)
Anion gap: 10 (ref 5–15)
BUN: 13 mg/dL (ref 6–20)
CHLORIDE: 109 mmol/L (ref 101–111)
CO2: 24 mmol/L (ref 22–32)
CREATININE: 0.97 mg/dL (ref 0.44–1.00)
Calcium: 8.3 mg/dL — ABNORMAL LOW (ref 8.9–10.3)
GFR calc Af Amer: >60 mL/min (ref >60)
GFR calc non Af Amer: >60 mL/min (ref >60)
GLUCOSE: 89 mg/dL (ref 65–99)
Potassium: 3.7 mmol/L (ref 3.5–5.1)
SODIUM: 143 mmol/L (ref 135–145)
Total Bilirubin: 0.5 mg/dL (ref 0.3–1.2)
Total Protein: 6.4 g/dL — ABNORMAL LOW (ref 6.5–8.1)

## 2017-11-19 LAB — I-STAT BETA HCG BLOOD, ED (MC, WL, AP ONLY)

## 2017-11-19 LAB — URINALYSIS, ROUTINE W REFLEX MICROSCOPIC
BILIRUBIN URINE: NEGATIVE
GLUCOSE, UA: NEGATIVE mg/dL
HGB URINE DIPSTICK: NEGATIVE
Ketones, ur: NEGATIVE mg/dL
Leukocytes, UA: NEGATIVE
Nitrite: NEGATIVE
Protein, ur: NEGATIVE mg/dL
SPECIFIC GRAVITY, URINE: 1.023 (ref 1.005–1.030)
pH: 5 (ref 5.0–8.0)

## 2017-11-19 LAB — LIPASE, BLOOD: Lipase: 41 U/L (ref 11–51)

## 2017-11-19 MED ORDER — KETOROLAC TROMETHAMINE 30 MG/ML IJ SOLN
30.0000 mg | Freq: Once | INTRAMUSCULAR | Status: AC
  Administered 2017-11-19: 30 mg via INTRAVENOUS
  Filled 2017-11-19: qty 1

## 2017-11-19 MED ORDER — SODIUM CHLORIDE 0.9 % IV BOLUS
1000.0000 mL | Freq: Once | INTRAVENOUS | Status: AC
  Administered 2017-11-19: 1000 mL via INTRAVENOUS

## 2017-11-19 MED ORDER — NAPROXEN 500 MG PO TABS: 500.0000 mg | ORAL_TABLET | Freq: Two times a day (BID) | ORAL | 0 refills | Status: DC

## 2017-11-19 MED ORDER — LOPERAMIDE HCL 2 MG PO CAPS: 2.0000 mg | ORAL_CAPSULE | ORAL | 0 refills | Status: DC | PRN

## 2017-11-19 NOTE — Discharge Instructions (Signed)
Begin taking Imodium if your symptoms do not resolve in 3 days.

## 2017-11-19 NOTE — ED Triage Notes (Signed)
Patient is complaining of lower abdominal pain that started around 5 pm last night. Patient has been nauseas and diarrhea. No vomiting.

## 2017-11-19 NOTE — ED Notes (Addendum)
Pt resting in bed at this time. Family is at bedside. No complaints at this time. Pt updated on plan of care. Family transported to radiology at this time.

## 2017-11-19 NOTE — ED Provider Notes (Signed)
[facility] DEPT Provider Note   CSN: [id number] Arrival date & time: 11/19/17  4098     History   Chief Complaint Chief Complaint  Patient presents with  . Abdominal Pain    HPI Lori Blackwell is a 27 y.o. female with past medical history of asthma, who presents to ED for evaluation of 7-hour history of lower abdominal/pelvic pain, several episodes of diarrhea and nausea.  She states that she did experience some discomfort approximately 12 hours ago.  Patient ate a light meal of chicken noodle soup, saltine crackers and went to sleep.  However, she woke up with worsening of her pain.  She states that the pain is sharp, intermittent and rates at 8/10 at its worst.  The pain will sometimes radiate to her lower back.  She states she has nausea when the pain hits.  She has tried 2 doses of Pepto-Bismol with no improvement in her symptoms.  No sick contacts with similar symptoms.  She denies any hematemesis, vaginal discharge, abnormal vaginal bleeding, dysuria, hematuria.  Her last normal menstrual period was 2 weeks ago.  She is currently back on her birth control after being off for 1 week.  Prior abdominal surgeries include appendectomy at age 30.  HPI  Past Medical History:  Diagnosis Date  . Allergy   . Asthma    SEASONAL   . Bronchitis   . Chicken pox   . Complication of anesthesia    HARD TIME WAKING UP  . Dysrhythmia    H/O IN COLLEGE-HAD EKG WHICH PT STATES WAS NORMAL-NO PROBLEMS SINCE  . Heart murmur    BORN WITH HEART MURMUR-OUTGREW-ASYMPTOMATIC    Patient Active Problem List   Diagnosis Date Noted  . Acute upper respiratory infection 12/16/2015  . ASCUS favoring benign 12/05/2015  . Recurrent sinusitis 09/16/2015    Past Surgical History:  Procedure Laterality Date  . APPENDECTOMY  2001  . NASAL SINUS SURGERY N/A 11/13/2015   Procedure: ENDOSCOPIC SINUS SURGERY;  Surgeon: Carloyn Manner, MD;  Location: [facility] ORS;  Service: ENT;   Laterality: N/A;  . SEPTOPLASTY WITH ETHMOIDECTOMY, AND MAXILLARY ANTROSTOMY Bilateral 11/13/2015   Procedure: SEPTOPLASTY WITH ETHMOIDECTOMY, AND MAXILLARY ANTROSTOMY;  Surgeon: Carloyn Manner, MD;  Location: [facility] ORS;  Service: ENT;  Laterality: Bilateral;  bilateral maxillary antrostomy, left anterior ethmoidectomy, left frontal sinusotomy  . SHOULDER SURGERY Right 2008   2011  . TURBINATE REDUCTION Bilateral 11/13/2015   Procedure: TURBINATE REDUCTION;  Surgeon: Carloyn Manner, MD;  Location: [facility] ORS;  Service: ENT;  Laterality: Bilateral;  . WISDOM TOOTH EXTRACTION       OB History   None      Home Medications    Prior to Admission medications   Medication Sig Start Date End Date Taking? Authorizing Provider  albuterol (PROVENTIL HFA;VENTOLIN HFA) 108 (90 Base) MCG/ACT inhaler Inhale 2 puffs into the lungs every 6 (six) hours as needed for wheezing or shortness of breath. 12/16/15  Yes Leone Haven, MD  EPINEPHrine 0.3 mg/0.3 mL IJ SOAJ injection Inject 0.3 mLs (0.3 mg total) into the muscle once as needed. For anaphylactic reaction. 08/13/16  Yes Leone Haven, MD  norgestimate-ethinyl estradiol (ORTHO-CYCLEN,SPRINTEC,PREVIFEM) 0.25-35 MG-MCG tablet Take 1 tablet by mouth daily.   Yes [provider]  famciclovir (FAMVIR) 500 MG tablet Take 1 tablet (500 mg total) by mouth 3 (three) times daily. Patient not taking: Reported on 11/19/2017 09/01/17   Delano Metz, FNP  loperamide (IMODIUM) 2 MG capsule Take  1 capsule (2 mg total) by mouth as needed for diarrhea or loose stools. 11/19/17   Harshal Sirmon, PA-C  naproxen (NAPROSYN) 500 MG tablet Take 1 tablet (500 mg total) by mouth 2 (two) times daily. 11/19/17   Delia Heady, PA-C    Family History Family History  Problem Relation Age of Onset  . Hyperlipidemia Mother   . Cancer Maternal Aunt        breast cancer  . Cancer Maternal Grandmother        breast cancer    Social History Social History    Tobacco Use  . Smoking status: Never Smoker  . Smokeless tobacco: Never Used  Substance Use Topics  . Alcohol use: Yes    Comment: 1-3 drinks weekly  . Drug use: No     Allergies   Shellfish allergy and Sulfa antibiotics   Review of Systems Review of Systems  Constitutional: Negative for appetite change, chills and fever.  HENT: Negative for ear pain, rhinorrhea, sneezing and sore throat.   Eyes: Negative for photophobia and visual disturbance.  Respiratory: Negative for cough, chest tightness, shortness of breath and wheezing.   Cardiovascular: Negative for chest pain and palpitations.  Gastrointestinal: Positive for abdominal pain, diarrhea and nausea. Negative for blood in stool, constipation and vomiting.  Genitourinary: Negative for dysuria, hematuria and urgency.  Musculoskeletal: Negative for myalgias.  Skin: Negative for rash.  Neurological: Negative for dizziness, weakness and light-headedness.     Physical Exam Updated Vital Signs BP 116/67   Pulse 76   Temp 98.3 F (36.8 C) (Oral)   Resp 16   Ht 5\' 7"  (1.702 m)   Wt 65.8 kg (145 lb)   SpO2 100%   BMI 22.71 kg/m   Physical Exam  Constitutional: She appears well-developed and well-nourished. No distress.  Nontoxic appearing and in no acute distress.  HENT:  Head: Normocephalic and atraumatic.  Nose: Nose normal.  Eyes: Conjunctivae and EOM are normal. Left eye exhibits no discharge. No scleral icterus.  Neck: Normal range of motion. Neck supple.  Cardiovascular: Normal rate, regular rhythm, normal heart sounds and intact distal pulses. Exam reveals no gallop and no friction rub.  No murmur heard. Pulmonary/Chest: Effort normal and breath sounds normal. No respiratory distress.  Abdominal: Soft. Bowel sounds are normal. She exhibits no distension. There is tenderness in the suprapubic area. There is no guarding, no CVA tenderness, no tenderness at McBurney's point and negative Murphy's sign.     Musculoskeletal: Normal range of motion. She exhibits no edema.  Neurological: She is alert. She exhibits normal muscle tone. Coordination normal.  Skin: Skin is warm and dry. No rash noted.  Psychiatric: She has a normal mood and affect.  Nursing note and vitals reviewed.    ED Treatments / Results  Labs (all labs ordered are listed, but only abnormal results are displayed) Labs Reviewed  COMPREHENSIVE METABOLIC PANEL - Abnormal; Notable for the following components:      Result Value   Calcium 8.3 (*)    Total Protein 6.4 (*)    Albumin 3.4 (*)    All other components within normal limits  LIPASE, BLOOD  CBC  URINALYSIS, ROUTINE W REFLEX MICROSCOPIC  I-STAT BETA HCG BLOOD, ED ([facility], [facility], [facility] ONLY)    EKG None  Radiology US Transvaginal Non-ob  Result Date: 11/19/2017 CLINICAL DATA:  Pelvic pain.  Nausea and vomiting. EXAM: TRANSABDOMINAL AND TRANSVAGINAL ULTRASOUND OF PELVIS DOPPLER ULTRASOUND OF OVARIES TECHNIQUE: Both transabdominal and transvaginal ultrasound  examinations of the pelvis were performed. Transabdominal technique was performed for global imaging of the pelvis including uterus, ovaries, adnexal regions, and pelvic cul-de-sac. It was necessary to proceed with endovaginal exam following the transabdominal exam to visualize the endometrium and ovaries. Color and duplex Doppler ultrasound was utilized to evaluate blood flow to the ovaries. COMPARISON:  None. FINDINGS: Uterus Measurements: 6 x 2.7 x 3.6 cm. No fibroids or other mass visualized. Endometrium Thickness: 3.6 mm.  No focal abnormality visualized. Right ovary Measurements: 3.2 x 1.3 x 1.5 cm. Normal appearance/no adnexal mass. Left ovary Measurements: 2.7 x 1.6 x 1.8 cm. Normal appearance/no adnexal mass. Pulsed Doppler evaluation of both ovaries demonstrates normal low-resistance arterial and venous waveforms. Other findings There is a small amount of free fluid in the pelvis. IMPRESSION: 1. The uterus,  endometrium, and ovaries are normal. Symmetric blood flow seen within the ovaries. 2. A small to moderate amount of free fluid in the pelvis is nonspecific but may be physiologic or due to a ruptured follicle. Recommend clinical correlation. Electronically Signed   By: Dorise Bullion III M.D   On: 11/19/2017 08:29   US Pelvis Complete  Result Date: 11/19/2017 CLINICAL DATA:  Pelvic pain.  Nausea and vomiting. EXAM: TRANSABDOMINAL AND TRANSVAGINAL ULTRASOUND OF PELVIS DOPPLER ULTRASOUND OF OVARIES TECHNIQUE: Both transabdominal and transvaginal ultrasound examinations of the pelvis were performed. Transabdominal technique was performed for global imaging of the pelvis including uterus, ovaries, adnexal regions, and pelvic cul-de-sac. It was necessary to proceed with endovaginal exam following the transabdominal exam to visualize the endometrium and ovaries. Color and duplex Doppler ultrasound was utilized to evaluate blood flow to the ovaries. COMPARISON:  None. FINDINGS: Uterus Measurements: 6 x 2.7 x 3.6 cm. No fibroids or other mass visualized. Endometrium Thickness: 3.6 mm.  No focal abnormality visualized. Right ovary Measurements: 3.2 x 1.3 x 1.5 cm. Normal appearance/no adnexal mass. Left ovary Measurements: 2.7 x 1.6 x 1.8 cm. Normal appearance/no adnexal mass. Pulsed Doppler evaluation of both ovaries demonstrates normal low-resistance arterial and venous waveforms. Other findings There is a small amount of free fluid in the pelvis. IMPRESSION: 1. The uterus, endometrium, and ovaries are normal. Symmetric blood flow seen within the ovaries. 2. A small to moderate amount of free fluid in the pelvis is nonspecific but may be physiologic or due to a ruptured follicle. Recommend clinical correlation. Electronically Signed   By: Dorise Bullion III M.D   On: 11/19/2017 08:29   [place] Art/ven Flow Abd Pelv Doppler  Result Date: 11/19/2017 CLINICAL DATA:  Pelvic pain.  Nausea and vomiting. EXAM: TRANSABDOMINAL  AND TRANSVAGINAL ULTRASOUND OF PELVIS DOPPLER ULTRASOUND OF OVARIES TECHNIQUE: Both transabdominal and transvaginal ultrasound examinations of the pelvis were performed. Transabdominal technique was performed for global imaging of the pelvis including uterus, ovaries, adnexal regions, and pelvic cul-de-sac. It was necessary to proceed with endovaginal exam following the transabdominal exam to visualize the endometrium and ovaries. Color and duplex Doppler ultrasound was utilized to evaluate blood flow to the ovaries. COMPARISON:  None. FINDINGS: Uterus Measurements: 6 x 2.7 x 3.6 cm. No fibroids or other mass visualized. Endometrium Thickness: 3.6 mm.  No focal abnormality visualized. Right ovary Measurements: 3.2 x 1.3 x 1.5 cm. Normal appearance/no adnexal mass. Left ovary Measurements: 2.7 x 1.6 x 1.8 cm. Normal appearance/no adnexal mass. Pulsed Doppler evaluation of both ovaries demonstrates normal low-resistance arterial and venous waveforms. Other findings There is a small amount of free fluid in the pelvis. IMPRESSION: 1. The uterus,  endometrium, and ovaries are normal. Symmetric blood flow seen within the ovaries. 2. A small to moderate amount of free fluid in the pelvis is nonspecific but may be physiologic or due to a ruptured follicle. Recommend clinical correlation. Electronically Signed   By: Dorise Bullion III M.D   On: 11/19/2017 08:29    Procedures Procedures (including critical care time)  Medications Ordered in ED Medications  sodium chloride 0.9 % bolus 1,000 mL (1,000 mLs Intravenous New Bag/Given 11/19/17 0657)  ketorolac (TORADOL) 30 MG/ML injection 30 mg (30 mg Intravenous Given 11/19/17 9242)     Initial Impression / Assessment and Plan / ED Course  I have reviewed the triage vital signs and the nursing notes.  Pertinent labs & imaging results that were available during my care of the patient were reviewed by me and considered in my medical decision making (see chart for  details).     Patient presents to ED for evaluation of 7-hour history of lower abdominal/pelvic pain, several episodes of diarrhea and nausea.  She states that the pain is sharp, intermittent in in her lower pelvic area.  Denies any vaginal discharge, abnormal vaginal bleeding.  On physical exam she does have some abdominal tenderness to palpation as indicated in the image with no rebound or guarding present.  Lab work including CBC, CMP, lipase unremarkable.  Urinalysis no evidence of UTI.  HCG is negative.  Ultrasound of the pelvis was done to rule out torsion and return as negative as well.  Patient declines pelvic exam at this time and states that she would rather follow-up with her PCP and have it done there.  She reports significant improvement in her symptoms with fluids and anti-inflammatories given here in the ED.  Suspect that her symptoms could be due to gastroenteritis.  Will advise antidiarrheal if symptoms do not improve in 3 days.Low suspicion for surgical cause of symptoms such as torsion, cholecystitis.  Advised to follow-up with PCP for further evaluation if symptoms persist.  Advised to return for any severe worsening symptoms.  Portions of this note were generated with [employer]. Dictation errors may occur despite best attempts at proofreading.   Final Clinical Impressions(s) / ED Diagnoses   Final diagnoses:  Nausea vomiting and diarrhea    ED Discharge Orders        Ordered    loperamide (IMODIUM) 2 MG capsule  As needed     11/19/17 0855    naproxen (NAPROSYN) 500 MG tablet  2 times daily     11/19/17 Sunshine, Wylie, PA-C 11/19/17 0858    Molpus, Jenny Reichmann, MD 11/22/17 [phone]

## 2018-06-13 ENCOUNTER — Ambulatory Visit: Payer: BC Managed Care – PPO | Admitting: Family Medicine

## 2018-06-13 ENCOUNTER — Encounter: Payer: Self-pay | Admitting: Family Medicine

## 2018-06-13 VITALS — BP 118/70 | HR 88 | Temp 98.6°F | Ht 67.0 in | Wt 154.0 lb

## 2018-06-13 DIAGNOSIS — R110 Nausea: Secondary | ICD-10-CM | POA: Diagnosis not present

## 2018-06-13 DIAGNOSIS — J019 Acute sinusitis, unspecified: Secondary | ICD-10-CM

## 2018-06-13 DIAGNOSIS — J029 Acute pharyngitis, unspecified: Secondary | ICD-10-CM

## 2018-06-13 DIAGNOSIS — R1013 Epigastric pain: Secondary | ICD-10-CM | POA: Diagnosis not present

## 2018-06-13 DIAGNOSIS — R0981 Nasal congestion: Secondary | ICD-10-CM

## 2018-06-13 LAB — COMPREHENSIVE METABOLIC PANEL
ALK PHOS: 59 U/L (ref 39–117)
ALT: 12 U/L (ref 0–35)
AST: 18 U/L (ref 0–37)
Albumin: 3.9 g/dL (ref 3.5–5.2)
BUN: 12 mg/dL (ref 6–23)
CALCIUM: 9.1 mg/dL (ref 8.4–10.5)
CHLORIDE: 104 meq/L (ref 96–112)
CO2: 28 meq/L (ref 19–32)
Creatinine, Ser: 1.06 mg/dL (ref 0.40–1.20)
GFR: 64.81 mL/min (ref >60.00)
GLUCOSE: 107 mg/dL — AB (ref 70–99)
POTASSIUM: 4.6 meq/L (ref 3.5–5.1)
Sodium: 138 mEq/L (ref 135–145)
Total Bilirubin: 0.3 mg/dL (ref 0.2–1.2)
Total Protein: 6.9 g/dL (ref 6.0–8.3)

## 2018-06-13 LAB — CBC WITH DIFFERENTIAL/PLATELET
BASOS ABS: 0.1 10*3/uL (ref 0.0–0.1)
BASOS PCT: 0.7 % (ref 0.0–3.0)
Eosinophils Absolute: 0.4 10*3/uL (ref 0.0–0.7)
Eosinophils Relative: 3.8 % (ref 0.0–5.0)
HEMATOCRIT: 42.7 % (ref 36.0–46.0)
Hemoglobin: 14.2 g/dL (ref 12.0–15.0)
LYMPHS ABS: 1.6 10*3/uL (ref 0.7–4.0)
LYMPHS PCT: 16.2 % (ref 12.0–46.0)
MCHC: 33.3 g/dL (ref 30.0–36.0)
MCV: 90.4 fl (ref 78.0–100.0)
MONOS PCT: 6.4 % (ref 3.0–12.0)
Monocytes Absolute: 0.6 10*3/uL (ref 0.1–1.0)
NEUTROS ABS: 7.2 10*3/uL (ref 1.4–7.7)
NEUTROS PCT: 72.9 % (ref 43.0–77.0)
PLATELETS: 203 10*3/uL (ref 150.0–400.0)
RBC: 4.73 Mil/uL (ref 3.87–5.11)
RDW: 13.3 % (ref 11.5–15.5)
WBC: 9.9 10*3/uL (ref 4.0–10.5)

## 2018-06-13 LAB — POCT URINE PREGNANCY: PREG TEST UR: NEGATIVE

## 2018-06-13 LAB — POCT RAPID STREP A (OFFICE): Rapid Strep A Screen: NEGATIVE

## 2018-06-13 LAB — LIPASE: LIPASE: 33 U/L (ref 11.0–59.0)

## 2018-06-13 LAB — POC INFLUENZA A&B (BINAX/QUICKVUE)
INFLUENZA B, POC: NEGATIVE
Influenza A, POC: NEGATIVE

## 2018-06-13 LAB — AMYLASE: Amylase: 44 U/L (ref 27–131)

## 2018-06-13 MED ORDER — AMOXICILLIN-POT CLAVULANATE 875-125 MG PO TABS: 1.0000 | ORAL_TABLET | Freq: Two times a day (BID) | ORAL | 0 refills | Status: DC

## 2018-06-13 MED ORDER — OMEPRAZOLE 20 MG PO CPDR: 20.0000 mg | DELAYED_RELEASE_CAPSULE | Freq: Every day | ORAL | 3 refills | Status: DC

## 2018-06-13 NOTE — Progress Notes (Signed)
Subjective:    Patient ID: Lori Blackwell, female    DOB: 1991/03/14, 27 y.o.   MRN: [id number]  HPI  Presents to clinic c/o sore throat and nasal congestion & facial pain for about 8 days.  Patient has a long-standing history of sinus infections, had sinus surgery and ever since sinus infections have been much less severe.  States prior to sinus surgery she would have a constant sinus infection for almost the entire winter, but now she will get infections that are easily treatable with course of antibiotics.  Patient does report nasal drainage, had a fullness and feeling fatigued. Also reports sore throat that began 1 day ago.   Patient also complains of epigastric abdominal pain that started about a week ago.  First noticed the pain in her left shoulder blade, did not think much of it thought she had strained self at yoga.  The next day she began to have upper abdominal pain that seemed worse after eating.  Denies any vomiting, but does report nausea.  Denies any diarrhea.  Denies fever or chills.  Patient Active Problem List   Diagnosis Date Noted  . Acute upper respiratory infection 12/16/2015  . ASCUS favoring benign 12/05/2015  . Recurrent sinusitis 09/16/2015   Social History   Tobacco Use  . Smoking status: Never Smoker  . Smokeless tobacco: Never Used  Substance Use Topics  . Alcohol use: Yes    Comment: 1-3 drinks weekly    Review of Systems  Constitutional: Negative for chills, fatigue and fever.  HENT: +congestion, and sore throat.   Eyes: Negative.   Respiratory: Negative for cough, shortness of breath and wheezing.   Cardiovascular: Negative for chest pain, palpitations and leg swelling.  Gastrointestinal: +nausea and epigastric ABD pain. Negative diarrhea and vomiting.  Genitourinary: Negative for dysuria, frequency and urgency.  Musculoskeletal: Negative for arthralgias and myalgias.  Skin: Negative for color change, pallor and rash.  Neurological: Negative for  syncope, light-headedness and headaches.  Psychiatric/Behavioral: The patient is not nervous/anxious.       Objective:   Physical Exam  Constitutional: She is oriented to person, place, and time. She appears well-nourished. No distress.  HENT:  Head: Normocephalic and atraumatic.  Nose: Mucosal edema and rhinorrhea present. Right sinus exhibits maxillary sinus tenderness and frontal sinus tenderness. Left sinus exhibits maxillary sinus tenderness and frontal sinus tenderness.  +post nasal drip.   Eyes: Conjunctivae and EOM are normal. No scleral icterus.  Neck: Neck supple. No tracheal deviation present.  Cardiovascular: Normal rate, regular rhythm and normal heart sounds.  Pulmonary/Chest: Effort normal and breath sounds normal. No respiratory distress.  Abdominal: Soft. Bowel sounds are normal. There is tenderness. There is no rebound and no guarding.  +epigastric tenderness.   Musculoskeletal: She exhibits no edema.  Gait normal  Neurological: She is alert and oriented to person, place, and time. No cranial nerve deficit.  Skin: Skin is warm and dry. No pallor.  Psychiatric: She has a normal mood and affect. Her behavior is normal.  Nursing note and vitals reviewed.  Vitals:   06/13/18 1428  BP: 118/70  Pulse: 88  Temp: 98.6 F (37 C)  SpO2: 98%      Assessment & Plan:   Acute sinusitis, nasal congestion, sore throat- due to patient's long-standing history of sinus infection and facial pain/pressure we will treat with Augmentin twice daily daily to cover sinus infection.  Rapid strep and flu are negative in clinic.  Patient advised to rest, do  good handwashing, drink plenty of fluids.    Epigastric abdominal pain/nausea- pain could be related to possible acid reflux.  She will start omeprazole 20 mg once daily.  We will also get blood work including CBC, CMP, amylase and lipase to further investigate.  I suspect gallbladder could possibly be a culprit in the pain as well, we  will get abdominal ultrasound to evaluate.  Keep regularly scheduled follow-up with PCP as planned.  Return to clinic sooner if issues arise.  Once we have results of lab work and ultrasound, they will help determine the next step in our plan of care and if a referral to GI or general surgery is required.

## 2018-06-14 ENCOUNTER — Other Ambulatory Visit: Payer: Self-pay | Admitting: Family Medicine

## 2018-06-14 DIAGNOSIS — R1013 Epigastric pain: Secondary | ICD-10-CM

## 2018-06-14 DIAGNOSIS — R1011 Right upper quadrant pain: Secondary | ICD-10-CM

## 2018-06-15 ENCOUNTER — Ambulatory Visit
Admission: RE | Admit: 2018-06-15 | Discharge: 2018-06-15 | Disposition: A | Discharge status: Home / Self Care | Payer: BC Managed Care – PPO | Source: Ambulatory Visit | Attending: Family Medicine | Admitting: Family Medicine

## 2018-06-15 DIAGNOSIS — R1013 Epigastric pain: Secondary | ICD-10-CM | POA: Diagnosis not present

## 2018-06-15 DIAGNOSIS — R1011 Right upper quadrant pain: Secondary | ICD-10-CM | POA: Insufficient documentation

## 2018-06-15 LAB — CULTURE, UPPER RESPIRATORY
MICRO NUMBER: 91330671
SPECIMEN QUALITY: ADEQUATE

## 2018-06-16 ENCOUNTER — Telehealth: Payer: Self-pay | Admitting: Family Medicine

## 2018-06-16 NOTE — Telephone Encounter (Signed)
Charted in result notes. 

## 2018-06-16 NOTE — Telephone Encounter (Signed)
Patient calling back to obtain lab results. NT currently unavailable.. Please advise.    Copied from [place] [phone]. Topic: Quick Communication - Lab Results (Clinic Use ONLY) >> Jun 16, 2018  8:14 AM Neta Ehlers, [place] wrote: Called patient to inform them of 06/15/2018 lab results. When patient returns call, triage nurse may disclose results.

## 2018-08-09 HISTORY — PX: BREAST BIOPSY: SHX20

## 2018-08-15 ENCOUNTER — Other Ambulatory Visit: Payer: Self-pay | Admitting: Obstetrics and Gynecology

## 2018-08-15 DIAGNOSIS — N6320 Unspecified lump in the left breast, unspecified quadrant: Secondary | ICD-10-CM

## 2018-08-28 ENCOUNTER — Other Ambulatory Visit: Payer: Self-pay | Admitting: Obstetrics and Gynecology

## 2018-08-28 ENCOUNTER — Ambulatory Visit
Admission: RE | Admit: 2018-08-28 | Discharge: 2018-08-28 | Disposition: A | Discharge status: Home / Self Care | Payer: BC Managed Care – PPO | Source: Ambulatory Visit | Attending: Obstetrics and Gynecology | Admitting: Obstetrics and Gynecology

## 2018-08-28 DIAGNOSIS — N6320 Unspecified lump in the left breast, unspecified quadrant: Secondary | ICD-10-CM

## 2018-08-30 ENCOUNTER — Ambulatory Visit
Admission: RE | Admit: 2018-08-30 | Discharge: 2018-08-30 | Disposition: A | Discharge status: Home / Self Care | Payer: BC Managed Care – PPO | Source: Ambulatory Visit | Attending: Obstetrics and Gynecology | Admitting: Obstetrics and Gynecology

## 2018-08-30 DIAGNOSIS — N6320 Unspecified lump in the left breast, unspecified quadrant: Secondary | ICD-10-CM

## 2019-01-05 ENCOUNTER — Telehealth: Payer: Self-pay | Admitting: Family Medicine

## 2019-01-05 ENCOUNTER — Telehealth: Payer: Self-pay | Admitting: *Deleted

## 2019-01-05 ENCOUNTER — Encounter: Payer: Self-pay | Admitting: *Deleted

## 2019-01-05 NOTE — Telephone Encounter (Signed)
I am fine with this. I saw her once for a URI several years ago.

## 2019-01-05 NOTE — Telephone Encounter (Signed)
Are you Ok with transfer.

## 2019-01-05 NOTE — Telephone Encounter (Signed)
Copied from [place] [phone]. Topic: Appointment Scheduling - Transfer of Care >> Jan 05, 2019 10:20 AM Mathis Bud wrote: Pt is requesting to transfer FROM: Polo Riley MD- [facility]  Pt is requesting to transfer TO: any provider at [facility]  Reason for requested transfer: Patient moved to [place] to patient's current PCP (transferring FROM).

## 2019-01-05 NOTE — Telephone Encounter (Signed)
Copied from [place] [phone]. Topic: Appointment Scheduling - Transfer of Care >> Jan 05, 2019 10:20 AM Mathis Bud wrote: Pt is requesting to transfer FROM: Polo Riley MD- [facility]  Pt is requesting to transfer TO: any provider at [facility]  Reason for requested transfer: Patient moved to [place] to patient's current PCP (transferring FROM).

## 2019-01-12 NOTE — Telephone Encounter (Signed)
Patient is calling in to schedule TOC appointment with Dr.Banks at [facility]. Dr.Sonnenberg has already approved the transfer. Call back is [phone]

## 2019-01-12 NOTE — Telephone Encounter (Signed)
Dr. Volanda Napoleon - Please advise on request to transfer care. Thank you!

## 2019-01-14 NOTE — Telephone Encounter (Signed)
ok 

## 2019-01-16 NOTE — Telephone Encounter (Signed)
Dr Volanda Napoleon and Dr Caryl Bis have ok'd this, can you please call pt to set up? Pt is wanting to schedule appt

## 2019-01-17 NOTE — Telephone Encounter (Signed)
Pt has been scheduled.  °

## 2019-01-18 ENCOUNTER — Encounter: Payer: Self-pay | Admitting: Family Medicine

## 2019-01-18 ENCOUNTER — Ambulatory Visit (INDEPENDENT_AMBULATORY_CARE_PROVIDER_SITE_OTHER): Payer: 59 | Admitting: Family Medicine

## 2019-01-18 ENCOUNTER — Other Ambulatory Visit: Payer: Self-pay

## 2019-01-18 DIAGNOSIS — K582 Mixed irritable bowel syndrome: Secondary | ICD-10-CM | POA: Diagnosis not present

## 2019-01-18 NOTE — Progress Notes (Signed)
Virtual Visit via Video Note  I connected with Lori Blackwell on 01/18/19 at  3:30 PM EDT by a video enabled telemedicine application and verified that I am speaking with the correct person using two identifiers.  Location patient: home Location provider:work or home office Persons participating in the virtual visit: patient, provider  I discussed the limitations of evaluation and management by telemedicine and the availability of in person appointments. The patient expressed understanding and agreed to proceed.   HPI: Pt seen for f/u and TOC, previously seen by Dr. Caryl Bis.    Stomach issues: -x 6 mo -after eating gets bloated, abdomen becomes hard, haas increased gas, nausea, and feels crampy -happens even if has a few bites of food. -pt with predominately diarrhea and some constipation.  -does not note changes in bowel habits with stress -cut out dairy, caffeine, gluten, sugar.  Tried low FODMAP diet -eating more whole foods helps with diarrhea. -denies emesis, hematachezia, or PRBPR -dx'd with IBS as a child.   -OTC Gas X makes pt feel better.  Allergies: Shellfish- anaphylaxis Sulfa   Social Hx:  Pt is married.  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  She is now working for an Human resources officer for [employer].  She and her husband are considering starting a family soon.  Pt is currently on OCPs.  Not currently on a PNV.  Pt endorse social EtOH use.  Pt denies tobacco and drug use.  ROS: See pertinent positives and negatives per HPI.  Past Medical History:  Diagnosis Date  . Allergy   . Asthma    SEASONAL   . Bronchitis   . Chicken pox   . Complication of anesthesia    HARD TIME WAKING UP  . Dysrhythmia    H/O IN COLLEGE-HAD EKG WHICH PT STATES WAS NORMAL-NO PROBLEMS SINCE  . Heart murmur    BORN WITH HEART MURMUR-OUTGREW-ASYMPTOMATIC    Past Surgical History:  Procedure Laterality Date  . APPENDECTOMY  2001  . NASAL  SINUS SURGERY N/A 11/13/2015   Procedure: ENDOSCOPIC SINUS SURGERY;  Surgeon: Carloyn Manner, MD;  Location: [facility];  Service: ENT;  Laterality: N/A;  . SEPTOPLASTY WITH ETHMOIDECTOMY, AND MAXILLARY ANTROSTOMY Bilateral 11/13/2015   Procedure: SEPTOPLASTY WITH ETHMOIDECTOMY, AND MAXILLARY ANTROSTOMY;  Surgeon: Carloyn Manner, MD;  Location: [facility];  Service: ENT;  Laterality: Bilateral;  bilateral maxillary antrostomy, left anterior ethmoidectomy, left frontal sinusotomy  . SHOULDER SURGERY Right 2008   2011  . TURBINATE REDUCTION Bilateral 11/13/2015   Procedure: TURBINATE REDUCTION;  Surgeon: Carloyn Manner, MD;  Location: [facility];  Service: ENT;  Laterality: Bilateral;  . WISDOM TOOTH EXTRACTION      Family History  Problem Relation Age of Onset  . Hyperlipidemia Mother   . Cancer Maternal Aunt        breast cancer  . Cancer Maternal Grandmother        breast cancer    Current Outpatient Medications:  .  albuterol (PROVENTIL HFA;VENTOLIN HFA) 108 (90 Base) MCG/ACT inhaler, Inhale 2 puffs into the lungs every 6 (six) hours as needed for wheezing or shortness of breath., Disp: 1 Inhaler, Rfl: 0 .  amoxicillin-clavulanate (AUGMENTIN) 875-125 MG tablet, Take 1 tablet by mouth 2 (two) times daily., Disp: 20 tablet, Rfl: 0 .  EPINEPHrine 0.3 mg/0.3 mL IJ SOAJ injection, Inject 0.3 mLs (0.3 mg total) into the muscle once as needed. For anaphylactic reaction., Disp: 2 Device, Rfl: 0 .  famciclovir (FAMVIR) 500 MG tablet, Take 1 tablet (500 mg total) by mouth 3 (three) times daily., Disp: 21 tablet, Rfl: 0 .  loperamide (IMODIUM) 2 MG capsule, Take 1 capsule (2 mg total) by mouth as needed for diarrhea or loose stools., Disp: 5 capsule, Rfl: 0 .  naproxen (NAPROSYN) 500 MG tablet, Take 1 tablet (500 mg total) by mouth 2 (two) times daily., Disp: 30 tablet, Rfl: 0 .  norgestimate-ethinyl estradiol (ORTHO-CYCLEN,SPRINTEC,PREVIFEM) 0.25-35 MG-MCG tablet, Take 1 tablet by mouth daily., Disp: ,  Rfl:  .  omeprazole (PRILOSEC) 20 MG capsule, Take 1 capsule (20 mg total) by mouth daily., Disp: 30 capsule, Rfl: 3  EXAM:  VITALS per patient if applicable:  RR between 12-20 bpm  GENERAL: alert, oriented, appears well and in no acute distress  HEENT: atraumatic, conjunctiva clear, no obvious abnormalities on inspection of external nose and ears  NECK: normal movements of the head and neck  LUNGS: on inspection no signs of respiratory distress, breathing rate appears normal, no obvious gross SOB, gasping or wheezing  CV: no obvious cyanosis  MS: moves all visible extremities without noticeable abnormality  PSYCH/NEURO: pleasant and cooperative, no obvious depression or anxiety, speech and thought processing grossly intact  ASSESSMENT AND PLAN:  Discussed the following assessment and plan:  Irritable bowel syndrome with both constipation and diarrhea  -pt advised to keep a food diary -continue low FODMAP diet -given increasing symptoms will refer to GI - Plan: Ambulatory referral to Gastroenterology  F/u prn   I discussed the assessment and treatment plan with the patient. The patient was provided an opportunity to ask questions and all were answered. The patient agreed with the plan and demonstrated an understanding of the instructions.   The patient was advised to call back or seek an in-person evaluation if the symptoms worsen or if the condition fails to improve as anticipated.  Billie Ruddy, MD

## 2019-01-19 ENCOUNTER — Telehealth: Payer: Self-pay

## 2019-01-19 DIAGNOSIS — K582 Mixed irritable bowel syndrome: Secondary | ICD-10-CM | POA: Insufficient documentation

## 2019-01-19 NOTE — Telephone Encounter (Signed)
Copied from [place] [id number]. Topic: General - Other >> Jan 18, 2019  4:03 PM Celene Kras A wrote: Reason for CRM: Pt called with updated insuracne info. Please advise.

## 2019-02-06 ENCOUNTER — Telehealth: Payer: Self-pay | Admitting: General Surgery

## 2019-02-06 NOTE — Telephone Encounter (Signed)
Left a voicemail for the patient to call to covid screen for OV 02/07/2019

## 2019-02-07 ENCOUNTER — Encounter: Payer: Self-pay | Admitting: Nurse Practitioner

## 2019-02-07 ENCOUNTER — Telehealth: Payer: Self-pay

## 2019-02-07 ENCOUNTER — Ambulatory Visit (INDEPENDENT_AMBULATORY_CARE_PROVIDER_SITE_OTHER): Payer: 59 | Admitting: Nurse Practitioner

## 2019-02-07 ENCOUNTER — Other Ambulatory Visit: Payer: Self-pay

## 2019-02-07 VITALS — BP 114/76 | HR 98 | Temp 98.5°F | Ht 67.0 in | Wt 154.4 lb

## 2019-02-07 DIAGNOSIS — R195 Other fecal abnormalities: Secondary | ICD-10-CM

## 2019-02-07 DIAGNOSIS — R140 Abdominal distension (gaseous): Secondary | ICD-10-CM

## 2019-02-07 DIAGNOSIS — R194 Change in bowel habit: Secondary | ICD-10-CM

## 2019-02-07 DIAGNOSIS — R1030 Lower abdominal pain, unspecified: Secondary | ICD-10-CM

## 2019-02-07 DIAGNOSIS — R143 Flatulence: Secondary | ICD-10-CM

## 2019-02-07 NOTE — Progress Notes (Signed)
Agree with assessment and plan as outlined.  

## 2019-02-07 NOTE — Progress Notes (Signed)
ASSESSMENT / PLAN:   28 year old female with chronic but progressive postprandial bloating associated with lower abdominal discomfort, malodorous gas, and recent change in stool consistency from solid to loose. Patient has kept a food diary, eliminated several things from her diet without improvement. Probiotics nor Gas-x helpful.   I suspect she has IBS.  -C. difficile seems unlikely but she did take antibiotics in February so we will check stool sample to be on the safe side - obtain basic labs including ones for for celiac disease.  -Though she has done a nice job with food diary, will give brochure on low gas diet. -If labs do not point [place] in any direction as far as etiology of her symptoms will treat empirically with Xifaxan    HPI:    Chief Complaint:    Chronic bloating , lower abdominal pain, gas, distended stomach after eating  Patient is a 28 year old female referred by PCP Dr. Grier Mitts for evaluation of above complaints. Patient gives a history of IBS at age 64 but she grew out of the symptoms until just about 1.5 years ago.  Symptoms became more pronounced 6 months ago and even more progressive within the last 3 months. Complains of postprandial bloating, especially after the evening meal.  She has had a change in her bowels from solid stool 3-4 times a day to mainly loose stool 4-5 times a day.  One isolated episode of small volume rectal bleeding after mountain biking, otherwise no blood in stool. She did a food sensitivity test through the mail but everything checked out okay.  She has tried probiotics as well as Gas-X without much relief.  She has eliminated several things from her diet including gluten and dairy without relief. Symptoms occurs despite what or how little she eats. At night she can feel movements in her stomach, worse when lying on her left side.  Weight has been stable.  No joint aches, no fevers.  No no FMH of inflammatory bowel disease.   Data Reviewed: no recent labs or abdominal imaging  Past Medical History:  Diagnosis Date  . Allergy   . Asthma    SEASONAL   . Bronchitis   . Chicken pox   . Complication of anesthesia    HARD TIME WAKING UP  . Dysrhythmia    H/O IN COLLEGE-HAD EKG WHICH PT STATES WAS NORMAL-NO PROBLEMS SINCE  . Heart murmur    BORN WITH HEART MURMUR-OUTGREW-ASYMPTOMATIC     Past Surgical History:  Procedure Laterality Date  . APPENDECTOMY  2001  . BREAST BIOPSY  2020   fibroabnormal  . NASAL SINUS SURGERY N/A 11/13/2015   Procedure: ENDOSCOPIC SINUS SURGERY;  Surgeon: Carloyn Manner, MD;  Location: [facility];  Service: ENT;  Laterality: N/A;  . SEPTOPLASTY WITH ETHMOIDECTOMY, AND MAXILLARY ANTROSTOMY Bilateral 11/13/2015   Procedure: SEPTOPLASTY WITH ETHMOIDECTOMY, AND MAXILLARY ANTROSTOMY;  Surgeon: Carloyn Manner, MD;  Location: [facility];  Service: ENT;  Laterality: Bilateral;  bilateral maxillary antrostomy, left anterior ethmoidectomy, left frontal sinusotomy  . SHOULDER SURGERY Right 2008   2011  . TURBINATE REDUCTION Bilateral 11/13/2015   Procedure: TURBINATE REDUCTION;  Surgeon: Carloyn Manner, MD;  Location: [facility];  Service: ENT;  Laterality: Bilateral;  . WISDOM TOOTH EXTRACTION     Family History  Problem Relation Age of Onset  . Hyperlipidemia Mother   . Cancer Maternal Aunt  breast cancer  . Cancer Maternal Grandmother        breast cancer  . Colon cancer Neg Hx   . Esophageal cancer Neg Hx    Social History   Tobacco Use  . Smoking status: Never Smoker  . Smokeless tobacco: Never Used  Substance Use Topics  . Alcohol use: Yes    Comment: 1-3 drinks weekly  . Drug use: No   Current Outpatient Medications  Medication Sig Dispense Refill  . albuterol (PROVENTIL HFA;VENTOLIN HFA) 108 (90 Base) MCG/ACT inhaler Inhale 2 puffs into the lungs every 6 (six) hours as needed for wheezing or shortness of breath. 1 Inhaler 0  . levonorgestrel-ethinyl estradiol  (VIENVA) 0.1-20 MG-MCG tablet Take 1 tablet by mouth daily.    Marland Kitchen EPINEPHrine 0.3 mg/0.3 mL IJ SOAJ injection Inject 0.3 mLs (0.3 mg total) into the muscle once as needed. For anaphylactic reaction. (Patient not taking: Reported on 02/07/2019) 2 Device 0   No current facility-administered medications for this visit.    Allergies  Allergen Reactions  . Shellfish Allergy Anaphylaxis  . Sulfa Antibiotics Rash     Review of Systems: Positive for allergy/sinus trouble.  All other systems reviewed and negative except where noted in HPI.   Creatinine clearance cannot be calculated (Patient's most recent lab result is older than the maximum 21 days allowed.)   Physical Exam:    Wt Readings from Last 3 Encounters:  02/07/19 154 lb 6 oz (70 kg)  06/13/18 154 lb (69.9 kg)  11/19/17 145 lb (65.8 kg)    BP 114/76   Pulse 98   Temp 98.5 F (36.9 C)   Ht 5\' 7"  (1.702 m)   Wt 154 lb 6 oz (70 kg)   BMI 24.18 kg/m  Constitutional:  Pleasant female in no acute distress. Psychiatric: Normal mood and affect. Behavior is normal. EENT: Pupils normal.  Conjunctivae are normal. No scleral icterus. Neck supple.  Cardiovascular: Normal rate, regular rhythm. No edema Pulmonary/chest: Effort normal and breath sounds normal. No wheezing, rales or rhonchi. Abdominal: Soft, nondistended, nontender. Bowel sounds active throughout. There are no masses palpable. No hepatomegaly. Neurological: Alert and oriented to person place and time. Skin: Skin is warm and dry. No rashes noted.  Tye Savoy, NP  02/07/2019, 4:37 PM  Cc: Billie Ruddy, MD

## 2019-02-07 NOTE — Telephone Encounter (Signed)
Covid-19 screening questions   Do you now or have you had a fever in the last 14 days? No  Do you have any respiratory symptoms of shortness of breath or cough now or in the last 14 days? No  Do you have any family members or close contacts with diagnosed or suspected Covid-19 in the past 14 days? No  Have you been tested for Covid-19 and found to be positive? No        

## 2019-02-07 NOTE — Patient Instructions (Signed)
If you are age 30 or older, your body mass index should be between 23-30. Your Body mass index is 24.18 kg/m. If this is out of the aforementioned range listed, please consider follow up with your Primary Care Provider.  If you are age 5 or younger, your body mass index should be between 19-25. Your Body mass index is 24.18 kg/m. If this is out of the aformentioned range listed, please consider follow up with your Primary Care Provider.   Your provider has requested that you go to the basement level for lab work before leaving today. Press "B" on the elevator. The lab is located at the first door on the left as you exit the elevator.  You have been given Low Gas Diet.  We will call you with results.  Thank you for choosing me and [facility].   Tye Savoy, NP

## 2019-02-08 ENCOUNTER — Other Ambulatory Visit (INDEPENDENT_AMBULATORY_CARE_PROVIDER_SITE_OTHER): Payer: 59

## 2019-02-08 DIAGNOSIS — R1030 Lower abdominal pain, unspecified: Secondary | ICD-10-CM | POA: Diagnosis not present

## 2019-02-08 DIAGNOSIS — R195 Other fecal abnormalities: Secondary | ICD-10-CM

## 2019-02-08 DIAGNOSIS — R140 Abdominal distension (gaseous): Secondary | ICD-10-CM

## 2019-02-08 DIAGNOSIS — R143 Flatulence: Secondary | ICD-10-CM

## 2019-02-08 DIAGNOSIS — R194 Change in bowel habit: Secondary | ICD-10-CM | POA: Diagnosis not present

## 2019-02-08 LAB — COMPREHENSIVE METABOLIC PANEL
ALT: 16 U/L (ref 0–35)
AST: 18 U/L (ref 0–37)
Albumin: 4.2 g/dL (ref 3.5–5.2)
Alkaline Phosphatase: 48 U/L (ref 39–117)
BUN: 17 mg/dL (ref 6–23)
CO2: 27 mEq/L (ref 19–32)
Calcium: 9.3 mg/dL (ref 8.4–10.5)
Chloride: 102 mEq/L (ref 96–112)
Creatinine, Ser: 1.05 mg/dL (ref 0.40–1.20)
GFR: 61.38 mL/min (ref >60.00)
Glucose, Bld: 91 mg/dL (ref 70–99)
Potassium: 4.4 mEq/L (ref 3.5–5.1)
Sodium: 136 mEq/L (ref 135–145)
Total Bilirubin: 0.4 mg/dL (ref 0.2–1.2)
Total Protein: 7.1 g/dL (ref 6.0–8.3)

## 2019-02-08 LAB — IGA: IgA: 209 mg/dL (ref 68–378)

## 2019-02-08 LAB — CBC
HCT: 42 % (ref 36.0–46.0)
Hemoglobin: 14 g/dL (ref 12.0–15.0)
MCHC: 33.3 g/dL (ref 30.0–36.0)
MCV: 91.4 fl (ref 78.0–100.0)
Platelets: 238 10*3/uL (ref 150.0–400.0)
RBC: 4.59 Mil/uL (ref 3.87–5.11)
RDW: 12.7 % (ref 11.5–15.5)
WBC: 7.8 10*3/uL (ref 4.0–10.5)

## 2019-02-08 LAB — C.DIFFICILE TOXIN: C. Difficile Toxin A: NOT DETECTED

## 2019-02-12 LAB — TISSUE TRANSGLUTAMINASE, IGA: (tTG) Ab, IgA: 1 U/mL

## 2019-03-09 ENCOUNTER — Encounter: Payer: Self-pay | Admitting: Internal Medicine

## 2019-03-09 ENCOUNTER — Other Ambulatory Visit: Payer: Self-pay

## 2019-03-09 ENCOUNTER — Ambulatory Visit (INDEPENDENT_AMBULATORY_CARE_PROVIDER_SITE_OTHER): Payer: 59 | Admitting: Internal Medicine

## 2019-03-09 DIAGNOSIS — R21 Rash and other nonspecific skin eruption: Secondary | ICD-10-CM | POA: Diagnosis not present

## 2019-03-09 MED ORDER — TRIAMCINOLONE ACETONIDE 0.1 % EX CREA: 1.0000 "application " | TOPICAL_CREAM | Freq: Two times a day (BID) | CUTANEOUS | 1 refills | Status: DC

## 2019-03-09 NOTE — Progress Notes (Signed)
Virtual Visit via Video Note  I connected with@ on 03/09/19 at  3:00 PM EDT by a video enabled telemedicine application and verified that I am speaking with the correct person using two identifiers. Location patient: home Location provider:work  office Persons participating in the virtual visit: patient, provider  [place] national recommendations  regarding COVID 19 pandemic   video visit is advised over in office visit for this patient.  Patient aware  of the limitations of evaluation and management by telemedicine and  availability of in person appointments. and agreed to proceed.   HPI: Lori Blackwell presents for video visit pcp NA  Was a week? at the beach with family  And 4 days ago came home  And noted mildly itchy rash on right forearm that since that time has spread  With both arms and   On legs     Thighs and back of calves No new topicals detergents    sunscreens  did get more sun but not burned  On beach and some boating .   No one else with rash .  No edema  .   Hx allergy shellfish and  sulfda  Mild eczema as a child  ROS: See pertinent positives and negatives per HPI.no fever  Other sx    Past Medical History:  Diagnosis Date  . Allergy   . Asthma    SEASONAL   . Bronchitis   . Chicken pox   . Complication of anesthesia    HARD TIME WAKING UP  . Dysrhythmia    H/O IN COLLEGE-HAD EKG WHICH PT STATES WAS NORMAL-NO PROBLEMS SINCE  . Heart murmur    BORN WITH HEART MURMUR-OUTGREW-ASYMPTOMATIC    Past Surgical History:  Procedure Laterality Date  . APPENDECTOMY  2001  . BREAST BIOPSY  2020   fibroabnormal  . NASAL SINUS SURGERY N/A 11/13/2015   Procedure: ENDOSCOPIC SINUS SURGERY;  Surgeon: Carloyn Manner, MD;  Location: [facility];  Service: ENT;  Laterality: N/A;  . SEPTOPLASTY WITH ETHMOIDECTOMY, AND MAXILLARY ANTROSTOMY Bilateral 11/13/2015   Procedure: SEPTOPLASTY WITH ETHMOIDECTOMY, AND MAXILLARY ANTROSTOMY;  Surgeon: Carloyn Manner, MD;  Location: [facility];   Service: ENT;  Laterality: Bilateral;  bilateral maxillary antrostomy, left anterior ethmoidectomy, left frontal sinusotomy  . SHOULDER SURGERY Right 2008   2011  . TURBINATE REDUCTION Bilateral 11/13/2015   Procedure: TURBINATE REDUCTION;  Surgeon: Carloyn Manner, MD;  Location: [facility];  Service: ENT;  Laterality: Bilateral;  . WISDOM TOOTH EXTRACTION      Family History  Problem Relation Age of Onset  . Hyperlipidemia Mother   . Cancer Maternal Aunt        breast cancer  . Cancer Maternal Grandmother        breast cancer  . Colon cancer Neg Hx   . Esophageal cancer Neg Hx     Social History   Tobacco Use  . Smoking status: Never Smoker  . Smokeless tobacco: Never Used  Substance Use Topics  . Alcohol use: Yes    Comment: 1-3 drinks weekly  . Drug use: No      Current Outpatient Medications:  .  albuterol (PROVENTIL HFA;VENTOLIN HFA) 108 (90 Base) MCG/ACT inhaler, Inhale 2 puffs into the lungs every 6 (six) hours as needed for wheezing or shortness of breath., Disp: 1 Inhaler, Rfl: 0 .  EPINEPHrine 0.3 mg/0.3 mL IJ SOAJ injection, Inject 0.3 mLs (0.3 mg total) into the muscle once as needed. For anaphylactic reaction. (Patient not taking: Reported on  02/07/2019), Disp: 2 Device, Rfl: 0 .  levonorgestrel-ethinyl estradiol (VIENVA) 0.1-20 MG-MCG tablet, Take 1 tablet by mouth daily., Disp: , Rfl:  .  triamcinolone cream (KENALOG) 0.1 %, Apply 1 application topically 2 (two) times daily. To rash, Disp: 30 g, Rfl: 1  EXAM: BP Readings from Last 3 Encounters:  02/07/19 114/76  06/13/18 118/70  11/19/17 115/67    VITALS per patient if applicable: GENERAL: alert, oriented, appears well and in no acute distress HEENT: atraumatic, conjunttiva clear, no obvious abnormalities on inspection of external nose and ears NECK: normal movements of the head and neck LUNGS: on inspection no signs of respiratory distress, breathing rate appears normal, no obvious gross SOB, gasping or  wheezing CV: no obvious cyanosis MS: moves all visible extremities without noticeable abnormality Skin blotchy white areas righ forearm and post calf but face other is faints un changes  No vesicles or red himes  PSYCH/NEURO: pleasant and cooperative, no obvious depression or anxiety, speech and thought processing grossly intact   ASSESSMENT AND PLAN:  Discussed the following assessment and plan:    ICD-10-CM   1. Rash  R21    mildy itchy see note   Difficult to  Make a dx and not felt  But   Even though occurred  On way home from beach still suspect related to topical and head and sun  With remote hx of eczema may be more susceptible  . No alarm sx at this time advised antihistamine  Such as zyrtec  And topical steroid rxed cool environs and  Let pcp know next week if not getting better or concerns  Counseled.   Expectant management and discussion of plan and treatment with opportunity to ask questions and all were answered. The patient agreed with the plan and demonstrated an understanding of the instructions.   Advised to call back or seek an in-person evaluation if worsening  or having  further concerns .   Shanon Ace, MD

## 2019-05-08 ENCOUNTER — Ambulatory Visit (INDEPENDENT_AMBULATORY_CARE_PROVIDER_SITE_OTHER): Payer: 59 | Admitting: Family Medicine

## 2019-05-08 ENCOUNTER — Encounter: Payer: Self-pay | Admitting: Family Medicine

## 2019-05-08 ENCOUNTER — Other Ambulatory Visit: Payer: Self-pay

## 2019-05-08 ENCOUNTER — Ambulatory Visit (HOSPITAL_COMMUNITY)
Admission: RE | Admit: 2019-05-08 | Discharge: 2019-05-08 | Disposition: A | Discharge status: Home / Self Care | Payer: 59 | Source: Ambulatory Visit | Attending: Family Medicine | Admitting: Family Medicine

## 2019-05-08 VITALS — BP 110/82 | HR 76 | Temp 98.4°F | Ht 67.0 in | Wt 150.0 lb

## 2019-05-08 DIAGNOSIS — R319 Hematuria, unspecified: Secondary | ICD-10-CM | POA: Diagnosis not present

## 2019-05-08 DIAGNOSIS — R1032 Left lower quadrant pain: Secondary | ICD-10-CM | POA: Insufficient documentation

## 2019-05-08 DIAGNOSIS — R350 Frequency of micturition: Secondary | ICD-10-CM | POA: Diagnosis not present

## 2019-05-08 LAB — POCT URINALYSIS DIPSTICK
Bilirubin, UA: NEGATIVE
Glucose, UA: NEGATIVE
Ketones, UA: NEGATIVE
Leukocytes, UA: NEGATIVE
Nitrite, UA: NEGATIVE
Protein, UA: NEGATIVE
Spec Grav, UA: 1.015 (ref 1.010–1.025)
Urobilinogen, UA: 0.2 E.U./dL
pH, UA: 6 (ref 5.0–8.0)

## 2019-05-08 LAB — POCT URINE PREGNANCY: Preg Test, Ur: NEGATIVE

## 2019-05-08 MED ORDER — RIFAXIMIN 550 MG PO TABS: 550.0000 mg | ORAL_TABLET | Freq: Three times a day (TID) | ORAL | 0 refills | Status: DC

## 2019-05-08 MED ORDER — OXYCODONE-ACETAMINOPHEN 10-325 MG PO TABS: 1.0000 | ORAL_TABLET | Freq: Four times a day (QID) | ORAL | 0 refills | Status: AC | PRN

## 2019-05-08 NOTE — Progress Notes (Signed)
   Subjective:    Patient ID: Lori Blackwell, female    DOB: Apr 08, 1991, 28 y.o.   MRN: [id number]  HPI Here for the onset at 2:30 am this morning of severe sharp pain in the left lower back. She has never felt a pain like this before. Over the next few hours the pain moved around the left flank to the LLQ of the abdomen. She was very nauseated but did not vomit. No fever. She felt the urge to urinate frequently but most of the time could not produce any urine. No urinary burning or blood. Her BMs have been regular. Her LMP was 3 and 1/2 weeks ago, so she would normally be expected to start her menses this weekend. However 10 days ago she spent a weekend at the beach and she forgot to pack her BCP. Consequently this triggered a mini-period of light bleeding for 2 days. Around noon today the pain went back around to the left lower back, and it has eased up quite a bit in intensity.    Review of Systems  Constitutional: Negative.   Respiratory: Negative.   Cardiovascular: Negative.   Gastrointestinal: Positive for abdominal pain and nausea. Negative for abdominal distention, anal bleeding, blood in stool, constipation, diarrhea, rectal pain and vomiting.  Genitourinary: Positive for difficulty urinating, flank pain, frequency and pelvic pain. Negative for dysuria, enuresis, hematuria, urgency, vaginal bleeding, vaginal discharge and vaginal pain.       Objective:   Physical Exam Constitutional:      General: She is not in acute distress.    Appearance: Normal appearance. She is well-developed. She is not ill-appearing.  Cardiovascular:     Rate and Rhythm: Normal rate and regular rhythm.     Pulses: Normal pulses.     Heart sounds: Normal heart sounds.  Pulmonary:     Effort: Pulmonary effort is normal.     Breath sounds: Normal breath sounds.  Abdominal:     General: Abdomen is flat. Bowel sounds are normal. There is no distension.     Palpations: Abdomen is soft. There is no mass.   Tenderness: There is no guarding or rebound.     Hernia: No hernia is present.     Comments: Moderately tender in the left flank and the LLQ   Neurological:     Mental Status: She is alert.    The UA shows 1+ blood but negative nitrite and leuk est. Urine pregancy is negative.        Assessment & Plan:  Left flank pain, most likely due to a kidney stone. Other possible etiologies include a ruptured ovarian cyst. We will set up a stat renal stone CT study. Use Percocet as needed for pain. Drink lots of water.  Lori Penna, MD

## 2019-05-08 NOTE — Telephone Encounter (Signed)
-----   Message from Willia Craze, NP sent at 05/07/2019 12:07 PM EDT ----- Lori Blackwell wants to try Xifaxan. Will you send in a Rx for 550 mg TID x 14 days to the speciality pharmacy which assists patients in getting the medication? Thanks

## 2019-05-09 ENCOUNTER — Telehealth: Payer: Self-pay

## 2019-05-09 LAB — URINE CULTURE
MICRO NUMBER:: 934305
Result:: NO GROWTH
SPECIMEN QUALITY:: ADEQUATE

## 2019-05-09 NOTE — Telephone Encounter (Signed)
Copied from [place] [phone]. Topic: General - Other >> May 09, 2019  8:24 AM Keene Breath wrote: Reason for CRM: Patient is returning a call.  CB# [phone]

## 2019-05-09 NOTE — Telephone Encounter (Signed)
This has been taking care of.

## 2019-05-11 ENCOUNTER — Other Ambulatory Visit: Payer: Self-pay

## 2019-05-11 ENCOUNTER — Telehealth: Payer: Self-pay

## 2019-05-11 MED ORDER — RIFAXIMIN 550 MG PO TABS: 550.0000 mg | ORAL_TABLET | Freq: Three times a day (TID) | ORAL | 0 refills | Status: AC

## 2019-05-11 NOTE — Telephone Encounter (Signed)
Medication has been sent to [facility]. Records also have been faxed to [facility]. Will await PA information

## 2019-05-16 ENCOUNTER — Telehealth: Payer: Self-pay

## 2019-05-16 NOTE — Telephone Encounter (Signed)
Received approval for Xifaxan for [employer]  Left message on patients voicemail regarding approval.  Letter scanned to chart.

## 2020-01-02 ENCOUNTER — Encounter: Payer: Self-pay | Admitting: Family Medicine

## 2020-01-02 ENCOUNTER — Telehealth (INDEPENDENT_AMBULATORY_CARE_PROVIDER_SITE_OTHER): Payer: BC Managed Care – PPO | Admitting: Family Medicine

## 2020-01-02 DIAGNOSIS — J019 Acute sinusitis, unspecified: Secondary | ICD-10-CM | POA: Diagnosis not present

## 2020-01-02 DIAGNOSIS — Z7189 Other specified counseling: Secondary | ICD-10-CM

## 2020-01-02 MED ORDER — AMOXICILLIN-POT CLAVULANATE 875-125 MG PO TABS: 1.0000 | ORAL_TABLET | Freq: Two times a day (BID) | ORAL | 0 refills | Status: AC

## 2020-01-02 NOTE — Progress Notes (Signed)
Virtual Visit via Video Note  I connected with Lori Blackwell on 01/02/20 at  3:00 PM EDT by a video enabled telemedicine application 2/2 [id number] pandemic and verified that I am speaking with the correct person using two identifiers.  Location patient: home Location provider:work or home office Persons participating in the virtual visit: patient, provider  I discussed the limitations of evaluation and management by telemedicine and the availability of in person appointments. The patient expressed understanding and agreed to proceed.   HPI: Pt is a 29 yo female with pmh sig for asthma, sinus issues, IBS-Next, seasonal allergies seen for ongoing concern.   Pt with HAs since last Thursday.  She then developed coughing, congestions, sore throat, ear pain and pressure, and facial pain.  Pt has a h/o recurrent sinus infections and sinus surgery in 2017.   Denies rhinorrhea.  Pt tried dayquil yesterday as she wanted to know if she developed a fever.  Pt's aunt was sick with pneumonia recently, but was gone antibiotics times at least 8 days prior to being around patient.  Pt had COVID in Oct.  At that time she lost her sense of taste and smell.  Pt has since received Covid vaccinations.   ROS: See pertinent positives and negatives per HPI.  Past Medical History:  Diagnosis Date  . Allergy   . Asthma    SEASONAL   . Bronchitis   . Chicken pox   . Complication of anesthesia    HARD TIME WAKING UP  . Dysrhythmia    H/O IN COLLEGE-HAD EKG WHICH PT STATES WAS NORMAL-NO PROBLEMS SINCE  . Heart murmur    BORN WITH HEART MURMUR-OUTGREW-ASYMPTOMATIC    Past Surgical History:  Procedure Laterality Date  . APPENDECTOMY  2001  . BREAST BIOPSY  2020   fibroabnormal  . NASAL SINUS SURGERY N/A 11/13/2015   Procedure: ENDOSCOPIC SINUS SURGERY;  Surgeon: Carloyn Manner, MD;  Location: [facility];  Service: ENT;  Laterality: N/A;  . SEPTOPLASTY WITH ETHMOIDECTOMY, AND MAXILLARY ANTROSTOMY Bilateral  11/13/2015   Procedure: SEPTOPLASTY WITH ETHMOIDECTOMY, AND MAXILLARY ANTROSTOMY;  Surgeon: Carloyn Manner, MD;  Location: [facility];  Service: ENT;  Laterality: Bilateral;  bilateral maxillary antrostomy, left anterior ethmoidectomy, left frontal sinusotomy  . SHOULDER SURGERY Right 2008   2011  . TURBINATE REDUCTION Bilateral 11/13/2015   Procedure: TURBINATE REDUCTION;  Surgeon: Carloyn Manner, MD;  Location: [facility];  Service: ENT;  Laterality: Bilateral;  . WISDOM TOOTH EXTRACTION      Family History  Problem Relation Age of Onset  . Hyperlipidemia Mother   . Cancer Maternal Aunt        breast cancer  . Cancer Maternal Grandmother        breast cancer  . Colon cancer Neg Hx   . Esophageal cancer Neg Hx     Current Outpatient Medications:  .  albuterol (PROVENTIL HFA;VENTOLIN HFA) 108 (90 Base) MCG/ACT inhaler, Inhale 2 puffs into the lungs every 6 (six) hours as needed for wheezing or shortness of breath., Disp: 1 Inhaler, Rfl: 0 .  EPINEPHrine 0.3 mg/0.3 mL IJ SOAJ injection, Inject 0.3 mLs (0.3 mg total) into the muscle once as needed. For anaphylactic reaction. (Patient not taking: Reported on 02/07/2019), Disp: 2 Device, Rfl: 0 .  levonorgestrel-ethinyl estradiol (VIENVA) 0.1-20 MG-MCG tablet, Take 1 tablet by mouth daily., Disp: , Rfl:  .  triamcinolone cream (KENALOG) 0.1 %, Apply 1 application topically 2 (two) times daily. To rash, Disp: 30 g, Rfl: 1  EXAM:  VITALS per patient if applicable: RR between 123456 bpm  GENERAL: alert, oriented, appears well and in no acute distress  HEENT: atraumatic, conjunctiva clear, no obvious abnormalities on inspection of external nose and ears  NECK: normal movements of the head and neck  LUNGS: on inspection no signs of respiratory distress, breathing rate appears normal, no obvious gross SOB, gasping or wheezing  CV: no obvious cyanosis  MS: moves all visible extremities without noticeable abnormality  PSYCH/NEURO: pleasant and  cooperative, no obvious depression or anxiety, speech and thought processing grossly intact  ASSESSMENT AND PLAN:  Discussed the following assessment and plan:  Acute sinusitis, recurrence not specified, unspecified location  -Given longstanding history of sinusitis will send in Rx for Augmentin -Continue supportive care including Flonase -Given precautions -For continued or worsening symptoms consider Covid testing - Plan: amoxicillin-clavulanate (AUGMENTIN) 875-125 MG tablet  Educated about COVID-19 virus infection -Discussed signs and symptoms of COVID-19 virus infection -Consider Covid testing for continued or worsening symptoms -Discussed self quarantine   Follow-up as needed  I discussed the assessment and treatment plan with the patient. The patient was provided an opportunity to ask questions and all were answered. The patient agreed with the plan and demonstrated an understanding of the instructions.   The patient was advised to call back or seek an in-person evaluation if the symptoms worsen or if the condition fails to improve as anticipated.  I provided 9 minutes of non-face-to-face time during this encounter.   Billie Ruddy, MD

## 2020-04-01 DIAGNOSIS — N979 Female infertility, unspecified: Secondary | ICD-10-CM | POA: Diagnosis not present

## 2020-04-01 DIAGNOSIS — Z6824 Body mass index (BMI) 24.0-24.9, adult: Secondary | ICD-10-CM | POA: Diagnosis not present

## 2020-04-04 ENCOUNTER — Other Ambulatory Visit: Payer: Self-pay | Admitting: Obstetrics and Gynecology

## 2020-04-04 DIAGNOSIS — N979 Female infertility, unspecified: Secondary | ICD-10-CM

## 2020-04-16 DIAGNOSIS — Z6823 Body mass index (BMI) 23.0-23.9, adult: Secondary | ICD-10-CM | POA: Diagnosis not present

## 2020-04-16 DIAGNOSIS — N946 Dysmenorrhea, unspecified: Secondary | ICD-10-CM | POA: Diagnosis not present

## 2020-05-05 ENCOUNTER — Ambulatory Visit
Admission: RE | Admit: 2020-05-05 | Discharge: 2020-05-05 | Disposition: A | Discharge status: Home / Self Care | Payer: BC Managed Care – PPO | Source: Ambulatory Visit | Attending: Obstetrics and Gynecology | Admitting: Obstetrics and Gynecology

## 2020-05-05 DIAGNOSIS — N979 Female infertility, unspecified: Secondary | ICD-10-CM

## 2020-07-30 DIAGNOSIS — Z3490 Encounter for supervision of normal pregnancy, unspecified, unspecified trimester: Secondary | ICD-10-CM | POA: Diagnosis not present

## 2020-07-30 DIAGNOSIS — Z3202 Encounter for pregnancy test, result negative: Secondary | ICD-10-CM | POA: Diagnosis not present

## 2020-07-30 DIAGNOSIS — Z113 Encounter for screening for infections with a predominantly sexual mode of transmission: Secondary | ICD-10-CM | POA: Diagnosis not present

## 2020-07-30 DIAGNOSIS — Z01419 Encounter for gynecological examination (general) (routine) without abnormal findings: Secondary | ICD-10-CM | POA: Diagnosis not present

## 2020-07-30 DIAGNOSIS — N925 Other specified irregular menstruation: Secondary | ICD-10-CM | POA: Diagnosis not present

## 2020-07-30 DIAGNOSIS — Z124 Encounter for screening for malignant neoplasm of cervix: Secondary | ICD-10-CM | POA: Diagnosis not present

## 2020-07-31 ENCOUNTER — Other Ambulatory Visit: Payer: Self-pay | Admitting: Obstetrics and Gynecology

## 2020-07-31 DIAGNOSIS — O26841 Uterine size-date discrepancy, first trimester: Secondary | ICD-10-CM

## 2020-08-21 ENCOUNTER — Ambulatory Visit
Admission: RE | Admit: 2020-08-21 | Discharge: 2020-08-21 | Disposition: A | Discharge status: Home / Self Care | Payer: BC Managed Care – PPO | Source: Ambulatory Visit | Attending: Obstetrics and Gynecology | Admitting: Obstetrics and Gynecology

## 2020-08-21 ENCOUNTER — Other Ambulatory Visit: Payer: Self-pay

## 2020-08-21 DIAGNOSIS — Z3A01 Less than 8 weeks gestation of pregnancy: Secondary | ICD-10-CM | POA: Diagnosis not present

## 2020-08-21 DIAGNOSIS — O26841 Uterine size-date discrepancy, first trimester: Secondary | ICD-10-CM | POA: Diagnosis not present

## 2020-09-10 DIAGNOSIS — Z369 Encounter for antenatal screening, unspecified: Secondary | ICD-10-CM | POA: Diagnosis not present

## 2020-09-10 DIAGNOSIS — Z3481 Encounter for supervision of other normal pregnancy, first trimester: Secondary | ICD-10-CM | POA: Diagnosis not present

## 2020-09-10 DIAGNOSIS — Z31430 Encounter of female for testing for genetic disease carrier status for procreative management: Secondary | ICD-10-CM | POA: Diagnosis not present

## 2020-09-10 DIAGNOSIS — Z3480 Encounter for supervision of other normal pregnancy, unspecified trimester: Secondary | ICD-10-CM | POA: Diagnosis not present

## 2020-09-10 LAB — OB RESULTS CONSOLE HIV ANTIBODY (ROUTINE TESTING): HIV: NONREACTIVE

## 2020-09-10 LAB — OB RESULTS CONSOLE ABO/RH: RH Type: POSITIVE

## 2020-09-10 LAB — OB RESULTS CONSOLE HEPATITIS B SURFACE ANTIGEN: Hepatitis B Surface Ag: NEGATIVE

## 2020-09-10 LAB — OB RESULTS CONSOLE ANTIBODY SCREEN: Antibody Screen: NEGATIVE

## 2020-09-10 LAB — OB RESULTS CONSOLE RPR: RPR: NONREACTIVE

## 2020-09-10 LAB — OB RESULTS CONSOLE RUBELLA ANTIBODY, IGM: Rubella: NON-IMMUNE/NOT IMMUNE

## 2020-09-19 ENCOUNTER — Other Ambulatory Visit: Payer: Self-pay | Admitting: Obstetrics and Gynecology

## 2020-09-19 DIAGNOSIS — Z363 Encounter for antenatal screening for malformations: Secondary | ICD-10-CM

## 2020-10-07 DIAGNOSIS — Z369 Encounter for antenatal screening, unspecified: Secondary | ICD-10-CM | POA: Diagnosis not present

## 2020-10-24 DIAGNOSIS — O26872 Cervical shortening, second trimester: Secondary | ICD-10-CM | POA: Diagnosis not present

## 2020-10-24 DIAGNOSIS — Z3492 Encounter for supervision of normal pregnancy, unspecified, second trimester: Secondary | ICD-10-CM | POA: Diagnosis not present

## 2020-11-03 ENCOUNTER — Ambulatory Visit: Payer: BC Managed Care – PPO

## 2020-11-18 ENCOUNTER — Encounter: Payer: Self-pay | Admitting: *Deleted

## 2020-11-19 ENCOUNTER — Other Ambulatory Visit: Payer: Self-pay

## 2020-11-19 ENCOUNTER — Other Ambulatory Visit: Payer: Self-pay | Admitting: Obstetrics and Gynecology

## 2020-11-19 ENCOUNTER — Ambulatory Visit: Payer: BC Managed Care – PPO | Attending: Obstetrics and Gynecology

## 2020-11-19 DIAGNOSIS — Z363 Encounter for antenatal screening for malformations: Secondary | ICD-10-CM | POA: Insufficient documentation

## 2020-11-20 DIAGNOSIS — Z369 Encounter for antenatal screening, unspecified: Secondary | ICD-10-CM | POA: Diagnosis not present

## 2020-12-12 DIAGNOSIS — Z369 Encounter for antenatal screening, unspecified: Secondary | ICD-10-CM | POA: Diagnosis not present

## 2021-01-09 DIAGNOSIS — Z3480 Encounter for supervision of other normal pregnancy, unspecified trimester: Secondary | ICD-10-CM | POA: Diagnosis not present

## 2021-01-09 DIAGNOSIS — Z23 Encounter for immunization: Secondary | ICD-10-CM | POA: Diagnosis not present

## 2021-01-27 DIAGNOSIS — Z369 Encounter for antenatal screening, unspecified: Secondary | ICD-10-CM | POA: Diagnosis not present

## 2021-02-20 DIAGNOSIS — Z369 Encounter for antenatal screening, unspecified: Secondary | ICD-10-CM | POA: Diagnosis not present

## 2021-03-06 DIAGNOSIS — Z369 Encounter for antenatal screening, unspecified: Secondary | ICD-10-CM | POA: Diagnosis not present

## 2021-03-13 DIAGNOSIS — Z369 Encounter for antenatal screening, unspecified: Secondary | ICD-10-CM | POA: Diagnosis not present

## 2021-03-20 DIAGNOSIS — Z369 Encounter for antenatal screening, unspecified: Secondary | ICD-10-CM | POA: Diagnosis not present

## 2021-03-20 DIAGNOSIS — Z3480 Encounter for supervision of other normal pregnancy, unspecified trimester: Secondary | ICD-10-CM | POA: Diagnosis not present

## 2021-03-27 DIAGNOSIS — Z369 Encounter for antenatal screening, unspecified: Secondary | ICD-10-CM | POA: Diagnosis not present

## 2021-04-01 DIAGNOSIS — Z369 Encounter for antenatal screening, unspecified: Secondary | ICD-10-CM | POA: Diagnosis not present

## 2021-04-08 DIAGNOSIS — Z369 Encounter for antenatal screening, unspecified: Secondary | ICD-10-CM | POA: Diagnosis not present

## 2021-04-08 LAB — OB RESULTS CONSOLE GBS: GBS: NEGATIVE

## 2021-04-10 ENCOUNTER — Encounter (HOSPITAL_COMMUNITY): Payer: Self-pay

## 2021-04-10 ENCOUNTER — Telehealth (HOSPITAL_COMMUNITY): Payer: Self-pay | Admitting: *Deleted

## 2021-04-10 ENCOUNTER — Encounter (HOSPITAL_COMMUNITY): Payer: Self-pay | Admitting: *Deleted

## 2021-04-10 NOTE — Telephone Encounter (Signed)
Preadmission screen  

## 2021-04-14 DIAGNOSIS — O480 Post-term pregnancy: Secondary | ICD-10-CM | POA: Diagnosis not present

## 2021-04-15 ENCOUNTER — Encounter (HOSPITAL_COMMUNITY)
Admission: AD | Disposition: A | Discharge status: Home / Self Care | Payer: Self-pay | Source: Home / Self Care | Attending: Obstetrics

## 2021-04-15 ENCOUNTER — Other Ambulatory Visit: Payer: Self-pay

## 2021-04-15 ENCOUNTER — Inpatient Hospital Stay (HOSPITAL_COMMUNITY): Payer: BC Managed Care – PPO | Admitting: Anesthesiology

## 2021-04-15 ENCOUNTER — Encounter (HOSPITAL_COMMUNITY): Payer: Self-pay | Admitting: Obstetrics

## 2021-04-15 ENCOUNTER — Inpatient Hospital Stay (HOSPITAL_COMMUNITY)
Admission: AD | Admit: 2021-04-15 | Discharge: 2021-04-17 | DRG: 787 | Disposition: A | Discharge status: Home / Self Care | Payer: BC Managed Care – PPO | Attending: Obstetrics | Admitting: Obstetrics

## 2021-04-15 DIAGNOSIS — O321XX0 Maternal care for breech presentation, not applicable or unspecified: Secondary | ICD-10-CM | POA: Diagnosis present

## 2021-04-15 DIAGNOSIS — O321XX1 Maternal care for breech presentation, fetus 1: Secondary | ICD-10-CM | POA: Diagnosis not present

## 2021-04-15 DIAGNOSIS — Z23 Encounter for immunization: Secondary | ICD-10-CM | POA: Diagnosis not present

## 2021-04-15 DIAGNOSIS — O4290 Premature rupture of membranes, unspecified as to length of time between rupture and onset of labor, unspecified weeks of gestation: Secondary | ICD-10-CM | POA: Diagnosis present

## 2021-04-15 DIAGNOSIS — Z3A40 40 weeks gestation of pregnancy: Secondary | ICD-10-CM

## 2021-04-15 DIAGNOSIS — O9081 Anemia of the puerperium: Secondary | ICD-10-CM | POA: Diagnosis not present

## 2021-04-15 DIAGNOSIS — Z20822 Contact with and (suspected) exposure to covid-19: Secondary | ICD-10-CM | POA: Diagnosis present

## 2021-04-15 DIAGNOSIS — D62 Acute posthemorrhagic anemia: Secondary | ICD-10-CM | POA: Diagnosis not present

## 2021-04-15 DIAGNOSIS — O26893 Other specified pregnancy related conditions, third trimester: Secondary | ICD-10-CM | POA: Diagnosis not present

## 2021-04-15 DIAGNOSIS — O4292 Full-term premature rupture of membranes, unspecified as to length of time between rupture and onset of labor: Principal | ICD-10-CM | POA: Diagnosis present

## 2021-04-15 HISTORY — DX: Other specified postprocedural states: R11.2

## 2021-04-15 HISTORY — DX: Calculus of kidney: N20.0

## 2021-04-15 HISTORY — DX: Papillomavirus as the cause of diseases classified elsewhere: B97.7

## 2021-04-15 HISTORY — DX: Unspecified abnormal cytological findings in specimens from vagina: R87.629

## 2021-04-15 HISTORY — DX: Other specified postprocedural states: Z98.890

## 2021-04-15 HISTORY — DX: Chlamydial infection, unspecified: A74.9

## 2021-04-15 LAB — URINALYSIS, ROUTINE W REFLEX MICROSCOPIC
Bacteria, UA: NONE SEEN
Bilirubin Urine: NEGATIVE
Glucose, UA: NEGATIVE mg/dL
Ketones, ur: NEGATIVE mg/dL
Leukocytes,Ua: NEGATIVE
Nitrite: NEGATIVE
Protein, ur: NEGATIVE mg/dL
Specific Gravity, Urine: 1.021 (ref 1.005–1.030)
pH: 6 (ref 5.0–8.0)

## 2021-04-15 LAB — TYPE AND SCREEN
ABO/RH(D): B POS
Antibody Screen: NEGATIVE

## 2021-04-15 LAB — CBC
HCT: 37.8 % (ref 36.0–46.0)
Hemoglobin: 12.5 g/dL (ref 12.0–15.0)
MCH: 30.6 pg (ref 26.0–34.0)
MCHC: 33.1 g/dL (ref 30.0–36.0)
MCV: 92.6 fL (ref 80.0–100.0)
Platelets: 216 10*3/uL (ref 150–400)
RBC: 4.08 MIL/uL (ref 3.87–5.11)
RDW: 13.2 % (ref 11.5–15.5)
WBC: 11.7 10*3/uL — ABNORMAL HIGH (ref 4.0–10.5)
nRBC: 0 % (ref 0.0–0.2)

## 2021-04-15 LAB — RESP PANEL BY RT-PCR (FLU A&B, COVID) ARPGX2
Influenza A by PCR: NEGATIVE
Influenza B by PCR: NEGATIVE
SARS Coronavirus 2 by RT PCR: NEGATIVE

## 2021-04-15 LAB — RPR: RPR Ser Ql: NONREACTIVE

## 2021-04-15 SURGERY — Surgical Case
Anesthesia: Epidural

## 2021-04-15 MED ORDER — DIPHENHYDRAMINE HCL 25 MG PO CAPS: 25.0000 mg | ORAL_CAPSULE | Freq: Four times a day (QID) | ORAL | Status: DC | PRN

## 2021-04-15 MED ORDER — LACTATED RINGERS IV SOLN
500.0000 mL | Freq: Once | INTRAVENOUS | Status: AC
  Administered 2021-04-15: 500 mL via INTRAVENOUS

## 2021-04-15 MED ORDER — SOD CITRATE-CITRIC ACID 500-334 MG/5ML PO SOLN
30.0000 mL | ORAL | Status: DC | PRN
  Administered 2021-04-15: 30 mL via ORAL
  Filled 2021-04-15: qty 30

## 2021-04-15 MED ORDER — LIDOCAINE-EPINEPHRINE (PF) 1.5 %-1:200000 IJ SOLN
INTRAMUSCULAR | Status: DC | PRN
  Administered 2021-04-15: 5 mL via PERINEURAL

## 2021-04-15 MED ORDER — MEPERIDINE HCL 25 MG/ML IJ SOLN
INTRAMUSCULAR | Status: AC
  Filled 2021-04-15: qty 1

## 2021-04-15 MED ORDER — SIMETHICONE 80 MG PO CHEW
80.0000 mg | CHEWABLE_TABLET | Freq: Three times a day (TID) | ORAL | Status: DC
  Administered 2021-04-16 – 2021-04-17 (×4): 80 mg via ORAL
  Filled 2021-04-15 (×4): qty 1

## 2021-04-15 MED ORDER — CEFAZOLIN SODIUM-DEXTROSE 2-4 GM/100ML-% IV SOLN
2.0000 g | Freq: Once | INTRAVENOUS | Status: AC
  Administered 2021-04-15: 2 g via INTRAVENOUS

## 2021-04-15 MED ORDER — OXYTOCIN-SODIUM CHLORIDE 30-0.9 UT/500ML-% IV SOLN
2.5000 [IU]/h | INTRAVENOUS | Status: DC
  Filled 2021-04-15: qty 500

## 2021-04-15 MED ORDER — LIDOCAINE HCL (PF) 1 % IJ SOLN
INTRAMUSCULAR | Status: DC | PRN
  Administered 2021-04-15: 3 mL via EPIDURAL

## 2021-04-15 MED ORDER — EPHEDRINE 5 MG/ML INJ: 10.0000 mg | INTRAVENOUS | Status: DC | PRN

## 2021-04-15 MED ORDER — ONDANSETRON HCL 4 MG/2ML IJ SOLN
INTRAMUSCULAR | Status: DC | PRN
Start: 2021-04-15 — End: 2021-04-15
  Administered 2021-04-15: 4 mg via INTRAVENOUS

## 2021-04-15 MED ORDER — ONDANSETRON HCL 4 MG/2ML IJ SOLN
INTRAMUSCULAR | Status: AC
  Filled 2021-04-15: qty 2

## 2021-04-15 MED ORDER — DIBUCAINE (PERIANAL) 1 % EX OINT: 1.0000 "application " | TOPICAL_OINTMENT | CUTANEOUS | Status: DC | PRN

## 2021-04-15 MED ORDER — FENTANYL CITRATE (PF) 100 MCG/2ML IJ SOLN: 50.0000 ug | INTRAMUSCULAR | Status: DC | PRN

## 2021-04-15 MED ORDER — SCOPOLAMINE 1 MG/3DAYS TD PT72
MEDICATED_PATCH | TRANSDERMAL | Status: AC
  Filled 2021-04-15: qty 1

## 2021-04-15 MED ORDER — KETOROLAC TROMETHAMINE 30 MG/ML IJ SOLN
INTRAMUSCULAR | Status: AC
  Filled 2021-04-15: qty 1

## 2021-04-15 MED ORDER — ONDANSETRON HCL 4 MG/2ML IJ SOLN
4.0000 mg | Freq: Four times a day (QID) | INTRAMUSCULAR | Status: DC | PRN
  Administered 2021-04-15: 4 mg via INTRAVENOUS
  Filled 2021-04-15: qty 2

## 2021-04-15 MED ORDER — FENTANYL CITRATE (PF) 100 MCG/2ML IJ SOLN
INTRAMUSCULAR | Status: DC | PRN
  Administered 2021-04-15: 100 ug via EPIDURAL

## 2021-04-15 MED ORDER — MORPHINE SULFATE (PF) 0.5 MG/ML IJ SOLN
INTRAMUSCULAR | Status: AC
  Filled 2021-04-15: qty 10

## 2021-04-15 MED ORDER — LIDOCAINE HCL (PF) 1 % IJ SOLN: 30.0000 mL | INTRAMUSCULAR | Status: DC | PRN

## 2021-04-15 MED ORDER — SODIUM CHLORIDE 0.9 % IR SOLN
Status: DC | PRN
  Administered 2021-04-15: 1

## 2021-04-15 MED ORDER — COCONUT OIL OIL: 1.0000 "application " | TOPICAL_OIL | Status: DC | PRN

## 2021-04-15 MED ORDER — MORPHINE SULFATE (PF) 0.5 MG/ML IJ SOLN
INTRAMUSCULAR | Status: DC | PRN
  Administered 2021-04-15: 3 mg via EPIDURAL

## 2021-04-15 MED ORDER — MENTHOL 3 MG MT LOZG: 1.0000 | LOZENGE | OROMUCOSAL | Status: DC | PRN

## 2021-04-15 MED ORDER — LACTATED RINGERS IV SOLN: INTRAVENOUS | Status: DC

## 2021-04-15 MED ORDER — LIDOCAINE-EPINEPHRINE (PF) 2 %-1:200000 IJ SOLN
INTRAMUSCULAR | Status: DC | PRN
  Administered 2021-04-15: 5 mL via EPIDURAL
  Administered 2021-04-15: 3 mL via EPIDURAL
  Administered 2021-04-15: 5 mL via EPIDURAL

## 2021-04-15 MED ORDER — PHENYLEPHRINE 40 MCG/ML (10ML) SYRINGE FOR IV PUSH (FOR BLOOD PRESSURE SUPPORT): 80.0000 ug | PREFILLED_SYRINGE | INTRAVENOUS | Status: DC | PRN

## 2021-04-15 MED ORDER — PRENATAL MULTIVITAMIN CH
1.0000 | ORAL_TABLET | Freq: Every day | ORAL | Status: DC
  Administered 2021-04-16 – 2021-04-17 (×2): 1 via ORAL
  Filled 2021-04-15 (×2): qty 1

## 2021-04-15 MED ORDER — SODIUM BICARBONATE 8.4 % IV SOLN
INTRAVENOUS | Status: AC
  Filled 2021-04-15: qty 50

## 2021-04-15 MED ORDER — LACTATED RINGERS IV SOLN
500.0000 mL | INTRAVENOUS | Status: DC | PRN
  Administered 2021-04-15: 500 mL via INTRAVENOUS

## 2021-04-15 MED ORDER — MISOPROSTOL 25 MCG QUARTER TABLET: 25.0000 ug | ORAL_TABLET | ORAL | Status: DC | PRN

## 2021-04-15 MED ORDER — OXYTOCIN-SODIUM CHLORIDE 30-0.9 UT/500ML-% IV SOLN
INTRAVENOUS | Status: DC | PRN
  Administered 2021-04-15: 300 mL via INTRAVENOUS

## 2021-04-15 MED ORDER — KETOROLAC TROMETHAMINE 30 MG/ML IJ SOLN
30.0000 mg | Freq: Four times a day (QID) | INTRAMUSCULAR | Status: AC
  Administered 2021-04-15 – 2021-04-16 (×4): 30 mg via INTRAVENOUS
  Filled 2021-04-15 (×3): qty 1

## 2021-04-15 MED ORDER — OXYTOCIN-SODIUM CHLORIDE 30-0.9 UT/500ML-% IV SOLN: 2.5000 [IU]/h | INTRAVENOUS | Status: AC

## 2021-04-15 MED ORDER — ACETAMINOPHEN 325 MG PO TABS: 650.0000 mg | ORAL_TABLET | ORAL | Status: DC | PRN

## 2021-04-15 MED ORDER — OXYCODONE-ACETAMINOPHEN 5-325 MG PO TABS: 2.0000 | ORAL_TABLET | ORAL | Status: DC | PRN

## 2021-04-15 MED ORDER — ACETAMINOPHEN 500 MG PO TABS
1000.0000 mg | ORAL_TABLET | Freq: Four times a day (QID) | ORAL | Status: DC
  Administered 2021-04-16 – 2021-04-17 (×7): 1000 mg via ORAL
  Filled 2021-04-15 (×8): qty 2

## 2021-04-15 MED ORDER — STERILE WATER FOR IRRIGATION IR SOLN
Status: DC | PRN
  Administered 2021-04-15: 1000 mL

## 2021-04-15 MED ORDER — TERBUTALINE SULFATE 1 MG/ML IJ SOLN: 0.2500 mg | Freq: Once | INTRAMUSCULAR | Status: DC | PRN

## 2021-04-15 MED ORDER — FENTANYL-BUPIVACAINE-NACL 0.5-0.125-0.9 MG/250ML-% EP SOLN
12.0000 mL/h | EPIDURAL | Status: DC | PRN
  Administered 2021-04-15: 12 mL/h via EPIDURAL
  Filled 2021-04-15: qty 250

## 2021-04-15 MED ORDER — OXYTOCIN-SODIUM CHLORIDE 30-0.9 UT/500ML-% IV SOLN
INTRAVENOUS | Status: AC
  Filled 2021-04-15: qty 500

## 2021-04-15 MED ORDER — OXYTOCIN BOLUS FROM INFUSION: 333.0000 mL | Freq: Once | INTRAVENOUS | Status: DC

## 2021-04-15 MED ORDER — MEPERIDINE HCL 25 MG/ML IJ SOLN
INTRAMUSCULAR | Status: DC | PRN
  Administered 2021-04-15 (×2): 12.5 mg via INTRAVENOUS

## 2021-04-15 MED ORDER — SODIUM BICARBONATE 8.4 % IV SOLN: INTRAVENOUS | Status: DC | PRN

## 2021-04-15 MED ORDER — OXYCODONE-ACETAMINOPHEN 5-325 MG PO TABS: 1.0000 | ORAL_TABLET | ORAL | Status: DC | PRN

## 2021-04-15 MED ORDER — DIPHENHYDRAMINE HCL 50 MG/ML IJ SOLN: 12.5000 mg | INTRAMUSCULAR | Status: DC | PRN

## 2021-04-15 MED ORDER — IBUPROFEN 600 MG PO TABS
600.0000 mg | ORAL_TABLET | Freq: Four times a day (QID) | ORAL | Status: DC
  Administered 2021-04-16 – 2021-04-17 (×4): 600 mg via ORAL
  Filled 2021-04-15 (×4): qty 1

## 2021-04-15 MED ORDER — SIMETHICONE 80 MG PO CHEW: 80.0000 mg | CHEWABLE_TABLET | ORAL | Status: DC | PRN

## 2021-04-15 MED ORDER — DEXAMETHASONE SODIUM PHOSPHATE 10 MG/ML IJ SOLN
INTRAMUSCULAR | Status: DC | PRN
  Administered 2021-04-15: 10 mg via INTRAVENOUS

## 2021-04-15 MED ORDER — SENNOSIDES-DOCUSATE SODIUM 8.6-50 MG PO TABS
2.0000 | ORAL_TABLET | ORAL | Status: DC
  Administered 2021-04-16: 2 via ORAL
  Filled 2021-04-15: qty 2

## 2021-04-15 MED ORDER — WITCH HAZEL-GLYCERIN EX PADS: 1.0000 "application " | MEDICATED_PAD | CUTANEOUS | Status: DC | PRN

## 2021-04-15 MED ORDER — FENTANYL CITRATE (PF) 100 MCG/2ML IJ SOLN
INTRAMUSCULAR | Status: AC
  Filled 2021-04-15: qty 2

## 2021-04-15 MED ORDER — DEXAMETHASONE SODIUM PHOSPHATE 10 MG/ML IJ SOLN
INTRAMUSCULAR | Status: AC
  Filled 2021-04-15: qty 1

## 2021-04-15 MED ORDER — OXYCODONE HCL 5 MG PO TABS: 5.0000 mg | ORAL_TABLET | ORAL | Status: DC | PRN

## 2021-04-15 MED ORDER — OXYTOCIN-SODIUM CHLORIDE 30-0.9 UT/500ML-% IV SOLN
1.0000 m[IU]/min | INTRAVENOUS | Status: DC
  Administered 2021-04-15: 2 m[IU]/min via INTRAVENOUS

## 2021-04-15 SURGICAL SUPPLY — 37 items
BENZOIN TINCTURE PRP APPL 2/3 (GAUZE/BANDAGES/DRESSINGS) ×2 IMPLANT
CHLORAPREP W/TINT 26ML (MISCELLANEOUS) ×2 IMPLANT
CLAMP CORD UMBIL (MISCELLANEOUS) IMPLANT
CLOTH BEACON ORANGE TIMEOUT ST (SAFETY) ×2 IMPLANT
DRSG OPSITE POSTOP 4X10 (GAUZE/BANDAGES/DRESSINGS) ×2 IMPLANT
ELECT REM PT RETURN 9FT ADLT (ELECTROSURGICAL) ×2
ELECTRODE REM PT RTRN 9FT ADLT (ELECTROSURGICAL) ×1 IMPLANT
EXTRACTOR VACUUM KIWI (MISCELLANEOUS) IMPLANT
GLOVE BIOGEL PI IND STRL 6.5 (GLOVE) ×1 IMPLANT
GLOVE BIOGEL PI IND STRL 7.0 (GLOVE) ×1 IMPLANT
GLOVE BIOGEL PI INDICATOR 6.5 (GLOVE) ×1
GLOVE BIOGEL PI INDICATOR 7.0 (GLOVE) ×1
GLOVE ECLIPSE 6.0 STRL STRAW (GLOVE) ×2 IMPLANT
GOWN STRL REUS W/TWL LRG LVL3 (GOWN DISPOSABLE) ×4 IMPLANT
KIT ABG SYR 3ML LUER SLIP (SYRINGE) IMPLANT
NEEDLE HYPO 25X5/8 SAFETYGLIDE (NEEDLE) IMPLANT
NS IRRIG 1000ML POUR BTL (IV SOLUTION) ×2 IMPLANT
PACK C SECTION WH (CUSTOM PROCEDURE TRAY) ×2 IMPLANT
PAD OB MATERNITY 4.3X12.25 (PERSONAL CARE ITEMS) ×2 IMPLANT
PENCIL SMOKE EVAC W/HOLSTER (ELECTROSURGICAL) ×2 IMPLANT
RTRCTR C-SECT PINK 25CM LRG (MISCELLANEOUS) ×2 IMPLANT
STRIP CLOSURE SKIN 1/2X4 (GAUZE/BANDAGES/DRESSINGS) ×2 IMPLANT
SUT MNCRL 0 VIOLET CTX 36 (SUTURE) ×2 IMPLANT
SUT MONOCRYL 0 CTX 36 (SUTURE) ×2
SUT PLAIN 0 NONE (SUTURE) IMPLANT
SUT PLAIN 2 0 (SUTURE) ×2
SUT PLAIN ABS 2-0 CT1 27XMFL (SUTURE) ×2 IMPLANT
SUT VIC AB 0 CTX 36 (SUTURE) ×2
SUT VIC AB 0 CTX36XBRD ANBCTRL (SUTURE) ×2 IMPLANT
SUT VIC AB 2-0 CT1 27 (SUTURE) ×1
SUT VIC AB 2-0 CT1 TAPERPNT 27 (SUTURE) ×1 IMPLANT
SUT VIC AB 3-0 SH 27 (SUTURE) ×1
SUT VIC AB 3-0 SH 27X BRD (SUTURE) ×1 IMPLANT
SUT VICRYL 4-0 PS2 18IN ABS (SUTURE) ×2 IMPLANT
TOWEL OR 17X24 6PK STRL BLUE (TOWEL DISPOSABLE) ×2 IMPLANT
TRAY FOLEY W/BAG SLVR 14FR LF (SET/KITS/TRAYS/PACK) ×2 IMPLANT
WATER STERILE IRR 1000ML POUR (IV SOLUTION) ×2 IMPLANT

## 2021-04-15 NOTE — MAU Note (Signed)
Continues to leak clear fluid, new pad in place.

## 2021-04-15 NOTE — H&P (Signed)
30 y.o. G1P0 @ 73w3dpresents with rupture of membranes at 0630.  She was grossly ruptured in MAU.  She reports painful contractions starting with ROM.  Otherwise has good fetal movement and no bleeding.  She is dated by a 10 week ultrasound.  Her pregnancy has been uncomplicated.   Past Medical History:  Diagnosis Date   Allergy    Asthma    SEASONAL    Bronchitis    Chicken pox    Chlamydia    from prenatal   Complication of anesthesia    HARD TIME WAKING UP   Dysrhythmia    H/O IN COLLEGE-HAD EKG WHICH PT STATES WAS NORMAL-NO PROBLEMS SINCE   Heart murmur    BORN WITH HEART MURMUR-OUTGREW-ASYMPTOMATIC   HPV (human papilloma virus) infection    from prenatal   Kidney stone    PONV (postoperative nausea and vomiting)    Vaginal Pap smear, abnormal     Past Surgical History:  Procedure Laterality Date   APPENDECTOMY  2001   BREAST BIOPSY  2020   fibroabnormal   LEEP     NASAL SINUS SURGERY N/A 11/13/2015   Procedure: ENDOSCOPIC SINUS SURGERY;  Surgeon: CCarloyn Manner MD;  Location: [facility];  Service: ENT;  Laterality: N/A;   SEPTOPLASTY WITH ETHMOIDECTOMY, AND MAXILLARY ANTROSTOMY Bilateral 11/13/2015   Procedure: SEPTOPLASTY WITH ETHMOIDECTOMY, AND MAXILLARY ANTROSTOMY;  Surgeon: CCarloyn Manner MD;  Location: [facility];  Service: ENT;  Laterality: Bilateral;  bilateral maxillary antrostomy, left anterior ethmoidectomy, left frontal sinusotomy   SHOULDER SURGERY Right 2008   2011   TURBINATE REDUCTION Bilateral 11/13/2015   Procedure: TURBINATE REDUCTION;  Surgeon: CCarloyn Manner MD;  Location: [facility];  Service: ENT;  Laterality: Bilateral;   WISDOM TOOTH EXTRACTION      OB History  Gravida Para Term Preterm AB Living  1            SAB IAB Ectopic Multiple Live Births               # Outcome Date GA Lbr Len/2nd Weight Sex Delivery Anes PTL Lv  1 Current             Social History   Socioeconomic History   Marital status: Married    Spouse name: JLarkin Ina   Number of children: 0   Years of education: 16   Highest education level: Not on file  Occupational History   Occupation: LHorticulturist, commercial Tobacco Use   Smoking status: Never   Smokeless tobacco: Never  Vaping Use   Vaping Use: Never used  Substance and Sexual Activity   Alcohol use: Not Currently    Comment: 1-3 drinks weekly   Drug use: No   Sexual activity: Not Currently  Other Topics Concern   Not on file  Social History Narrative   [place] She attended [employer] and 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   Social Determinants of Health   Financial Resource Strain: Not on file  Food Insecurity: Not on file  Transportation Needs: Not on file  Physical Activity: Not on file  Stress: Not on file  Social Connections: Not on file  Intimate Partner  Violence: Not on file   Shellfish allergy and Sulfa antibiotics    Prenatal Transfer Tool  Maternal Diabetes: No Genetic Screening: Normal Maternal Ultrasounds/Referrals: Normal Fetal Ultrasounds or other Referrals:  None Maternal Substance Abuse:  No Significant Maternal Medications:  Meds include: Other: pepcid Significant Maternal Lab Results: Group B Strep negative  ABO, Rh: --/--/PENDING (09/07 [id number]) Antibody: PENDING (09/07 0910) Rubella: Nonimmune (02/02 0000) RPR: Nonreactive (02/02 0000)  HBsAg: Negative (02/02 0000)  HIV: Non-reactive (02/02 0000)  GBS: Negative/-- (08/31 0000)      Vitals:   04/15/21 0854 04/15/21 1020  BP: 130/78 127/84  Pulse: 90 97  Resp:  16  Temp:  98.3 F (36.8 C)  SpO2:       General:  NAD Abdomen:  soft, gravid, EFW 7# Ex:  1+ edema SVE:  4/100/-3 per  RN FHTs:  120s, moderate variability, + accelerations, category 1 Toco:  q2-3 minutes   A/P   30 y.o. G1P0 4w3dpresents with SROM / labor Admit to L&D Epidural upon request Augmentation with pitocin as needed  FSR/ vtx/ GBS negative  DTwin [place]ves

## 2021-04-15 NOTE — MAU Note (Signed)
Presents stating having ctxs 4-6 minutes apart that have worsened in intensity since her water broke.  Reports water broke at 0630 this morning, states fluid clear.  Endorses +FM.  Denies VB.

## 2021-04-15 NOTE — Transfer of Care (Signed)
Immediate Anesthesia Transfer of Care Note  Patient: Lori Blackwell  Procedure(s) Performed: CESAREAN SECTION  Patient Location: PACU  Anesthesia Type:Epidural  Level of Consciousness: awake  Airway & Oxygen Therapy: Patient Spontanous Breathing  Post-op Assessment: Report given to RN  Post vital signs: Reviewed and stable  Last Vitals:  Vitals Value Taken Time  BP 105/33 04/15/21 1445  Temp    Pulse 105 04/15/21 1445  Resp 21 04/15/21 1445  SpO2 100 % 04/15/21 1445  Vitals shown include unvalidated device data.  Last Pain:  Vitals:   04/15/21 1314  TempSrc:   PainSc: 0-No pain      Patients Stated Pain Goal: 0 ([id number])  Complications: No notable events documented.

## 2021-04-15 NOTE — Anesthesia Preprocedure Evaluation (Signed)
Anesthesia Evaluation  Patient identified by MRN, date of birth, ID band Patient awake    Reviewed: Allergy & Precautions, H&P , NPO status , Patient's Chart, lab work & pertinent test results  Airway Mallampati: II  TM Distance: >3 FB     Dental   Pulmonary asthma ,    Pulmonary exam normal        Cardiovascular negative cardio ROS   Rhythm:regular Rate:Normal     Neuro/Psych negative neurological ROS  negative psych ROS   GI/Hepatic negative GI ROS, Neg liver ROS,   Endo/Other  negative endocrine ROS  Renal/GU negative Renal ROS  negative genitourinary   Musculoskeletal   Abdominal   Peds  Hematology negative hematology ROS (+)   Anesthesia Other Findings   Reproductive/Obstetrics (+) Pregnancy                             Anesthesia Physical Anesthesia Plan  ASA: 2  Anesthesia Plan: Epidural   Post-op Pain Management:    Induction:   PONV Risk Score and Plan:   Airway Management Planned:   Additional Equipment:   Intra-op Plan:   Post-operative Plan:   Informed Consent: I have reviewed the patients History and Physical, chart, labs and discussed the procedure including the risks, benefits and alternatives for the proposed anesthesia with the patient or authorized representative who has indicated his/her understanding and acceptance.       Plan Discussed with:   Anesthesia Plan Comments:         Anesthesia Quick Evaluation

## 2021-04-15 NOTE — Op Note (Signed)
Cesarean Section Procedure Note  Pre-operative Diagnosis: 1. Intrauterine pregnancy at [redacted]w[redacted]d 2. Active labor with spontaneous rupture of membranes 3. Breech presentation   Post-operative Diagnosis: same as above  Surgeon: DJerelyn Charles MD  Procedure: Primary low transverse cesarean section   Anesthesia: Epidural anesthesia  Estimated Blood Loss: 257 mL         Drains: Foley catheter         Specimens: placenta to L&D                      Disposition: PACU - hemodynamically stable.  Findings:  Normal uterus, tubes and ovaries bilaterally.  Viable female infant, 2940g (6lb 7.7 oz) Apgars 6, 8.    Procedure Details   Patient presented with spontaneous rupture of membranes in early labor.  She was started on pitocin for augmentation.  Following epidural placement, frank meconium was noted on the perineum.  Ultrasound revealed fetus in the frank breech position.  Pitocin was stopped.  Attempt at external cephalic version was unsuccessful.  She was consented for cesarean section and taken to the operating room urgently.    After epidural  anesthesia was found to adequate, the patient was placed in the dorsal supine position with a leftward tilt, prepped and draped in the usual sterile manner. A Pfannenstiel incision was made and carried down through the subcutaneous tissue to the fascia.  The fascia was incised in the midline and the fascial incision was extended laterally with Mayo scissors. The superior aspect of the fascial incision was grasped with two Kocher clamps, tented up and the rectus muscles dissected off sharply. The rectus was then dissected off with blunt dissection and Mayo scissors inferiorly. The rectus muscles were separated in the midline. The abdominal peritoneum was identified, tented up, entered bluntly, and the incision was extended superiorly and inferiorly with good visualization of the bladder. The Alexis retractor was deployed. The vesicouterine peritoneum was  identified, tented up, entered sharply, and the bladder flap was created digitally. A scalpel was then used to make a low transverse incision on the uterus which was extended in the cephalad-caudad direction with blunt dissection. The fluid was clear. The fetal breech was identified, elevated out of the pelvis and brought to the hysterotomy.  The fetus was delivered from the frank breech position without difficulty.  After a 451second delay, the cord was clamped and cut and the infant was passed to the waiting neonatologist due to poor tone that responded to warming, drying and stimulation.  The placenta was then delivered spontaneously, intact and appear normal, the uterus was cleared of all clot and debris   The hysterotomy was repaired with #0 Monocryl in running locked fashion.  A second imbricating layer of #0 Monocryl was placed.   Excellent hemostasis was noted.  The Alexis retractor was removed from the abdomen. The peritoneum was examined and all vessels noted to be hemostatic. The abdominal cavity was cleared of all clot and debris.  The peritoneum was closed with 2-0 vicryl in a running fashion.  The rectus muscles were then closed with 2-0 Vicryl. The fascia and rectus muscles were inspected and were hemostatic. The fascia was closed with 0 Vicryl in a running fashion. The subcutaneous layer was irrigated and all bleeders cauterized. The subcutaneous layer was closed with interrupted plain gut. The skin was closed with 3-0 vicryl in a subcuticular fashion. The incision was dressed with benzoine, steri strips and honeycomb dressing. All sponge lap and  needle counts were correct x3. Patient tolerated the procedure well and recovered in stable condition following the procedure.

## 2021-04-15 NOTE — Anesthesia Procedure Notes (Signed)
Epidural Patient location during procedure: OB  Staffing Anesthesiologist: Lidia Collum, MD Performed: anesthesiologist   Preanesthetic Checklist Completed: patient identified, IV checked, risks and benefits discussed, monitors and equipment checked, pre-op evaluation and timeout performed  Epidural Patient position: sitting Prep: DuraPrep Patient monitoring: heart rate, continuous pulse ox and blood pressure Approach: midline Location: L3-L4 Injection technique: LOR saline  Needle:  Needle type: Tuohy  Needle gauge: 18 G Needle length: 9 cm Needle insertion depth: 8 cm Catheter type: closed end Catheter size: 20 Guage Catheter at skin depth: 13 cm Test dose: negative and 1.5% lidocaine with Epi 1:200 K  Assessment Events: blood not aspirated, injection not painful, no injection resistance, no paresthesia and negative IV test  Additional Notes Reason for block:procedure for pain

## 2021-04-15 NOTE — Anesthesia Postprocedure Evaluation (Signed)
Anesthesia Post Note  Patient: Lori Blackwell  Procedure(s) Performed: CESAREAN SECTION     Patient location during evaluation: PACU Anesthesia Type: Epidural Level of consciousness: oriented and awake and alert Pain management: pain level controlled Vital Signs Assessment: post-procedure vital signs reviewed and stable Respiratory status: spontaneous breathing, respiratory function stable and nonlabored ventilation Cardiovascular status: blood pressure returned to baseline and stable Postop Assessment: no headache, no backache, no apparent nausea or vomiting and epidural receding Anesthetic complications: no   No notable events documented.  Last Vitals:  Vitals:   04/15/21 1530 04/15/21 1545  BP: (!) 95/49 (!) 98/59  Pulse: (!) 106 100  Resp: (!) 26 19  Temp:    SpO2: 100% 98%    Last Pain:  Vitals:   04/15/21 1545  TempSrc:   PainSc: 0-No pain   Pain Goal: Patients Stated Pain Goal: 0 (04/15/21 1041)  LLE Motor Response: Purposeful movement (04/15/21 1545) LLE Sensation: Numbness, Tingling (04/15/21 1545) RLE Motor Response: Purposeful movement (04/15/21 1545) RLE Sensation: Numbness, Tingling (04/15/21 1545)     Epidural/Spinal Function Cutaneous sensation: Tingles (04/15/21 1545), Patient able to flex knees: Yes (04/15/21 1545), Patient able to lift hips off bed: Yes (04/15/21 1545), Back pain beyond tenderness at insertion site: No (04/15/21 1545), Progressively worsening motor and/or sensory loss: No (04/15/21 1545), Bowel and/or bladder incontinence post epidural: No (04/15/21 1545)  Lidia Collum

## 2021-04-15 NOTE — Lactation Note (Signed)
This note was copied from a baby's chart. Lactation Consultation Note  Patient Name: Lori Blackwell [id number] Date: 04/15/2021 Reason for consult: Initial assessment;Difficult latch;Primapara;1st time breastfeeding;Term Age:32 hours  Initial visit to 4 hours old infant of a P1 mother. Mother is attempting latch. LC assisted with alignment, support pillows, and latch with different positioning. Infant is unable to latch after several attempts. Nipples are short shafted and breast tissue is dense. Use hand pump with 21-mm, collected and spoonfed ~6 mL of EBM.  May need a nipple shield. Mother brought her own DEBP but flanges are too big.  Reviewed normal newborn behavior during first 24h, expected output, tummy size and feeding frequency. Talked about consistent with current colostrum amount.   Plan: 1-Skin to skin, aim for a deep, comfortable latch and breastfeed on demand or 8-12 times in 24h period. 2-Encouraged maternal rest, hydration and food intake.  3-Contact LC as needed for feeds/support/concerns/questions   All questions answered at this time. Provided Lactation services brochure and promoted [employer] booklet information.     Maternal Data Has patient been taught Hand Expression?: Yes Does the patient have breastfeeding experience prior to this delivery?: No  Feeding Mother's Current Feeding Choice: Breast Milk  LATCH Score Latch: Repeated attempts needed to sustain latch, nipple held in mouth throughout feeding, stimulation needed to elicit sucking reflex.  Audible Swallowing: None  Type of Nipple: Everted at rest and after stimulation (short shafted)  Comfort (Breast/Nipple): Soft / non-tender  Hold (Positioning): Assistance needed to correctly position infant at breast and maintain latch.  LATCH Score: 6   Lactation Tools Discussed/Used Tools: Pump;Flanges Flange Size: 21 Breast pump type: Manual Pump Education: Setup, frequency, and cleaning;Milk  Storage Reason for Pumping: nipple eversion, stimulation and supplementation Pumping frequency: pre pumping Pumped volume: 6 mL  Interventions Interventions: Assisted with latch;Skin to skin;Breast massage;Hand express;Pre-pump if needed;Adjust position;Support pillows;Education;Hand pump;Expressed milk;Position options;Breast feeding basics reviewed  Discharge Pump: Personal;Manual WIC Program: No  Consult Status Consult Status: Follow-up Date: 04/16/21 Follow-up type: In-patient    Sumiye Hirth A Higuera Ancidey 04/15/2021, 5:58 PM

## 2021-04-15 NOTE — Progress Notes (Signed)
Patient checked following epidural placement and breech presentation noted.  Confirmed with BSUS.  Limited residual fluid given SROM.  With patient consent, attempt at ECV performed but unsuccessful and declines second attempt.   OR alerted.    We discussed the risks to cesarean section to include infection, bleeding, damage to surrounding structures (including but not limited to bowel, bladder, tubes, ovaries, nerves, vessels, baby), need for blood transfusion, venous thromboembolism, need for additional procedures.  Patient agrees, consent signed.

## 2021-04-16 ENCOUNTER — Encounter (HOSPITAL_COMMUNITY): Payer: Self-pay | Admitting: Obstetrics

## 2021-04-16 LAB — CBC
HCT: 29.7 % — ABNORMAL LOW (ref 36.0–46.0)
Hemoglobin: 9.8 g/dL — ABNORMAL LOW (ref 12.0–15.0)
MCH: 30.7 pg (ref 26.0–34.0)
MCHC: 33 g/dL (ref 30.0–36.0)
MCV: 93.1 fL (ref 80.0–100.0)
Platelets: 171 10*3/uL (ref 150–400)
RBC: 3.19 MIL/uL — ABNORMAL LOW (ref 3.87–5.11)
RDW: 13.2 % (ref 11.5–15.5)
WBC: 17.9 10*3/uL — ABNORMAL HIGH (ref 4.0–10.5)
nRBC: 0 % (ref 0.0–0.2)

## 2021-04-16 MED ORDER — CALCIUM CARBONATE ANTACID 500 MG PO CHEW
1.0000 | CHEWABLE_TABLET | ORAL | Status: DC | PRN
  Administered 2021-04-16 – 2021-04-17 (×2): 200 mg via ORAL
  Filled 2021-04-16 (×2): qty 1

## 2021-04-16 MED ORDER — FAMOTIDINE 20 MG PO TABS: 20.0000 mg | ORAL_TABLET | Freq: Two times a day (BID) | ORAL | Status: DC | PRN

## 2021-04-16 NOTE — Lactation Note (Signed)
This note was copied from a baby's chart. Lactation Consultation Note  Patient Name: Lori Blackwell [id number] Date: 04/16/2021 Reason for consult: Follow-up assessment;1st time breastfeeding;Term (Infant is currently spitty and gaggy, had large emesis while LC in the room.) Age:32 hours Per mom, infant has not been sustaining latch been on and off the breast. Mom pre-pumped breast prior to latching infant due to having flat nipples. Mom will wear breast shells in bra during the morning, mom has little areola edema on her right breast.. Mom attempted to latch infant on her right breast infant did not sustain latch and had large emesis at the breast, LC suction and burped infant. Mom hand expressed 4 mls of colostrum that was spoon feed to infant, after burping infant again, infant went to sleep. Mom will continue to work towards latching infant at the breast and ask RN/ LC for latch assistance.  Mom knows to BF infant according to feeding cues, 8 to 12+ times within 24 hours, skin to skin. Mom will continue to hand express and give infant back volume by spoon if infant doesn't latch at the breast and due a lot of skin to skin. Maternal Data Has patient been taught Hand Expression?: Yes Does the patient have breastfeeding experience prior to this delivery?: No  Feeding Mother's Current Feeding Choice: Breast Milk  LATCH Score Latch: Too sleepy or reluctant, no latch achieved, no sucking elicited.  Audible Swallowing: None  Type of Nipple: Flat  Comfort (Breast/Nipple): Soft / non-tender  Hold (Positioning): Assistance needed to correctly position infant at breast and maintain latch.  LATCH Score: 4   Lactation Tools Discussed/Used    Interventions Interventions: Skin to skin;Support pillows;Adjust position;Breast massage;Position options;Hand express;Expressed milk;Pre-pump if needed;Education  Discharge    Consult Status Consult Status: Follow-up Date:  04/16/21 Follow-up type: In-patient    Lori Blackwell 04/16/2021, 2:22 AM

## 2021-04-16 NOTE — Progress Notes (Signed)
Subjective: Postpartum Day #1: Cesarean Delivery Patient reports incisional pain, tolerating PO, and no problems voiding.    Objective: Vital signs in last 24 hours: Temp:  [97.5 F (36.4 C)-98.6 F (37 C)] 98.6 F (37 C) (09/08 0850) Pulse Rate:  [71-131] 77 (09/08 0850) Resp:  [15-26] 17 (09/08 0850) BP: (94-139)/(33-100) 109/62 (09/08 0850) SpO2:  [90 %-100 %] 99 % (09/08 0850)  Physical Exam:  General: alert Lochia: appropriate Uterine Fundus: firm Incision: dressing C/D/I  Recent Labs    04/15/21 0923 04/16/21 0621  HGB 12.5 9.8*  HCT 37.8 29.7*    Assessment/Plan: Status post Cesarean section. Doing well postoperatively. Asymptomatic acute blood loss anemia Continue current care, ambulate.  Lori Blackwell 04/16/2021, 9:17 AM

## 2021-04-16 NOTE — Lactation Note (Signed)
This note was copied from a baby's chart. Lactation Consultation Note  Patient Name: Lori Blackwell [id number] Date: 04/16/2021 Reason for consult: Follow-up assessment;Mother's request;Difficult latch;Primapara;1st time breastfeeding;Term Age:32 hours  Mom short shafted small nipples. LC attempted pre pumping and latching with help of 20 NS. Infant not initiate a suck.   Mom to pre pump for 5-10 min before latching or wear breast shells to help bring nipple out.   Plan 1. To feed based on cues 8-12x 24 hr period.  2. Mom to latch at the breast and look for signs of milk transfer with use of 20 NS. Mom to call for latch assistance with next feeding. 3. Mom to supplement with EBM first followed by DBM 7-12 ml after latching. Mom to offer more if infant not able to latch at the breast.  4. Mom to pump with DEBP q 3 hrs for 15 min All questions answered at the end of the visit.   Maternal Data Has patient been taught Hand Expression?: Yes Does the patient have breastfeeding experience prior to this delivery?: No  Feeding Mother's Current Feeding Choice: Breast Milk and Donor Milk Nipple Type: Slow - flow  LATCH Score                    Lactation Tools Discussed/Used Tools: Pump;Flanges Flange Size: 21 Breast pump type: Double-Electric Breast Pump Pump Education: Setup, frequency, and cleaning;Milk Storage Reason for Pumping: increase stimulation Pumping frequency: every 3 hrs for 15 min  Interventions Interventions: Breast feeding basics reviewed;Breast compression;Assisted with latch;Adjust position;Skin to skin;Support pillows;DEBP;Breast massage;Position options;Hand express;Expressed milk;Education;Coconut oil;Pace feeding;Shells  Discharge Pump: Personal  Consult Status Consult Status: Follow-up Date: 04/17/21 Follow-up type: In-patient    See Beharry  Nicholson-Springer 04/16/2021, 4:49 PM

## 2021-04-17 ENCOUNTER — Inpatient Hospital Stay (HOSPITAL_COMMUNITY): Payer: BC Managed Care – PPO

## 2021-04-17 ENCOUNTER — Inpatient Hospital Stay (HOSPITAL_COMMUNITY)
Admission: AD | Admit: 2021-04-17 | Payer: BC Managed Care – PPO | Source: Home / Self Care | Admitting: Obstetrics and Gynecology

## 2021-04-17 MED ORDER — MEASLES, MUMPS & RUBELLA VAC IJ SOLR
0.5000 mL | Freq: Once | INTRAMUSCULAR | Status: AC
  Administered 2021-04-17: 0.5 mL via SUBCUTANEOUS
  Filled 2021-04-17: qty 0.5

## 2021-04-17 MED ORDER — OXYCODONE HCL 5 MG PO TABS: 5.0000 mg | ORAL_TABLET | ORAL | 0 refills | Status: DC | PRN

## 2021-04-17 MED ORDER — ONDANSETRON 8 MG PO TBDP: 8.0000 mg | ORAL_TABLET | Freq: Three times a day (TID) | ORAL | 1 refills | Status: DC | PRN

## 2021-04-17 NOTE — Discharge Instructions (Signed)
Remove honeycomb dressing and steri-strips between day 3-4 post-surgery.

## 2021-04-17 NOTE — Progress Notes (Signed)
  Patient is eating, ambulating, voiding.  Pain control is good.  Vitals:   04/16/21 0408 04/16/21 0850 04/16/21 2113 04/17/21 0550  BP: (!) 113/55 109/62 112/66 112/62  Pulse: 75 77 78 90  Resp: '18 17 15 18  '$ Temp: 98.2 F (36.8 C) 98.6 F (37 C) 98.6 F (37 C) 98.5 F (36.9 C)  TempSrc: Oral Oral Oral Oral  SpO2: 100% 99% 100% 100%  Weight:      Height:        lungs:   clear to auscultation cor:    RRR Abdomen:  soft, appropriate tenderness, incisions intact and without erythema or exudate ex:    no cords   Lab Results  Component Value Date   WBC 17.9 (H) 04/16/2021   HGB 9.8 (L) 04/16/2021   HCT 29.7 (L) 04/16/2021   MCV 93.1 04/16/2021   PLT 171 04/16/2021    --/--/B POS (09/07 0910)/RNI  A/P    Post operative day 2.  Routine post op and postpartum care.  Expect d/c today.  Percocet for pain control.

## 2021-04-17 NOTE — Discharge Summary (Addendum)
Postpartum Discharge Summary  Date of Service updated      Patient Name: Lori Blackwell DOB: 01-08-1991 MRN: [id number]  Date of admission: 04/15/2021 Delivery date:04/15/2021  Delivering provider: Jerelyn Charles  Date of discharge: 04/17/2021  Admitting diagnosis: Full-term premature rupture of membranes (PROM) with unknown onset of labor [O42.90] Intrauterine pregnancy: [redacted]w[redacted]d     Secondary diagnosis:  Active Problems:   Full-term premature rupture of membranes (PROM) with unknown onset of labor  Additional problems: none    Discharge diagnosis: Term Pregnancy Delivered                                              Post partum procedures: none Augmentation: N/A Complications: None  Hospital course: Sceduled C/S   30 y.o. yo G1P1001 at [redacted]w[redacted]d was admitted to the hospital 04/15/2021 for scheduled cesarean section with the following indication:Malpresentation.Delivery details are as follows:  Membrane Rupture Time/Date: 6:30 AM ,04/15/2021   Delivery Method:C-Section, Low Transverse  Details of operation can be found in separate operative note.  Patient had an uncomplicated postpartum course.  She is ambulating, tolerating a regular diet, passing flatus, and urinating well. Patient is discharged home in stable condition on  04/17/21        Newborn Data: Birth date:04/15/2021  Birth time:1:55 PM  Gender:Female  Living status:Living  Apgars:6 ,8  Weight:2940 g     Magnesium Sulfate received: No BMZ received: No Rhophylac:No MMR:Yes T-DaP:Given prenatally Flu: No Transfusion:No  Physical exam  Vitals:   04/16/21 0408 04/16/21 0850 04/16/21 2113 04/17/21 0550  BP: (!) 113/55 109/62 112/66 112/62  Pulse: 75 77 78 90  Resp: $Remo'18 17 15 18  'QFhqn$ Temp: 98.2 F (36.8 C) 98.6 F (37 C) 98.6 F (37 C) 98.5 F (36.9 C)  TempSrc: Oral Oral Oral Oral  SpO2: 100% 99% 100% 100%  Weight:      Height:        Labs: Lab Results  Component Value Date   WBC 17.9 (H) 04/16/2021   HGB 9.8 (L)  04/16/2021   HCT 29.7 (L) 04/16/2021   MCV 93.1 04/16/2021   PLT 171 04/16/2021   CMP Latest Ref Rng & Units 02/08/2019  Glucose 70 - 99 mg/dL 91  BUN 6 - 23 mg/dL 17  Creatinine 0.40 - 1.20 mg/dL 1.05  Sodium 135 - 145 mEq/L 136  Potassium 3.5 - 5.1 mEq/L 4.4  Chloride 96 - 112 mEq/L 102  CO2 19 - 32 mEq/L 27  Calcium 8.4 - 10.5 mg/dL 9.3  Total Protein 6.0 - 8.3 g/dL 7.1  Total Bilirubin 0.2 - 1.2 mg/dL 0.4  Alkaline Phos 39 - 117 U/L 48  AST 0 - 37 U/L 18  ALT 0 - 35 U/L 16   Edinburgh Score: No flowsheet data found.    After visit meds:  Allergies as of 04/17/2021       Reactions   Shellfish Allergy Anaphylaxis   Sulfa Antibiotics Rash        Medication List     STOP taking these medications    EPINEPHrine 0.3 mg/0.3 mL Soaj injection Commonly known as: EPI-PEN   levonorgestrel-ethinyl estradiol 0.1-20 MG-MCG tablet Commonly known as: ALESSE       TAKE these medications    albuterol 108 (90 Base) MCG/ACT inhaler Commonly known as: VENTOLIN HFA Inhale 2 puffs into the lungs every  6 (six) hours as needed for wheezing or shortness of breath.   ondansetron 8 MG disintegrating tablet Commonly known as: Zofran ODT Take 1 tablet (8 mg total) by mouth every 8 (eight) hours as needed for nausea or vomiting.   oxyCODONE 5 MG immediate release tablet Commonly known as: Oxy IR/ROXICODONE Take 1-2 tablets (5-10 mg total) by mouth every 4 (four) hours as needed for moderate pain.   PEPCID AC PO Take by mouth 2 (two) times daily.   PRENATAL VITAMIN PO Take by mouth daily.   triamcinolone cream 0.1 % Commonly known as: KENALOG Apply 1 application topically 2 (two) times daily. To rash               Discharge Care Instructions  (From admission, onward)           Start     Ordered   04/17/21 0000  Discharge wound care:       Comments: Sitz baths and icepacks to perineum.  If stitches, they will dissolve.   04/17/21 0729              Discharge home in stable condition Infant Feeding:  ? Infant Disposition:home with mother Discharge instruction: per After Visit Summary and Postpartum booklet. Activity: Advance as tolerated. Pelvic rest for 6 weeks.  Diet: routine diet Anticipated Birth Control: Unsure Postpartum Appointment:4 weeks Additional Postpartum F/U:  none Future Appointments:No future appointments. Follow up Visit:  Follow-up Information     Jerelyn Charles, MD Follow up in 4 week(s).   Specialty: Obstetrics Contact information: [address] [place] [address] [phone]                     04/17/2021 Daria Pastures, MD

## 2021-04-17 NOTE — Lactation Note (Signed)
This note was copied from a baby's chart. Lactation Consultation Note  Patient Name: Lori Blackwell [id number] Date: 04/17/2021 Reason for consult: Follow-up assessment;Primapara;Term Age:32 hours  I showed parents a different way to supplement at the breast using 5 Fr/syringe instead of curved-tip syringe. Infant did much better and parents were pleased with the ease in supplementing. Dad was shown how to wash the 5 Fr & syringes. Parents know to feed infant until she is content.   Breast management/pumping discussed. Parents know how to reach [place] for post-d/c questions. Appt referral sent  to outpatient lactation clinic.   Mom had excellent breast changes with pregnancy (including veining). I anticipate Mom will have an abundant supply once her milk comes to volume.   Matthias Hughs [facility] 04/17/2021, 11:11 AM

## 2021-04-28 ENCOUNTER — Telehealth (HOSPITAL_COMMUNITY): Payer: Self-pay | Admitting: *Deleted

## 2021-04-28 NOTE — Telephone Encounter (Signed)
Mom reports feeling good. Incision healing well. No concerns about herself. EPDS=2 (no hospital score). Mom reports baby is well. Breastfeeding well. Peeing and pooping without difficulty. Sleeps on back in bassinet in parent's room. No concerns about baby.  Odis Hollingshead, RN 04-28-2021 at 1:50pm

## 2021-05-20 ENCOUNTER — Encounter: Payer: Self-pay | Admitting: Family Medicine

## 2021-07-30 ENCOUNTER — Ambulatory Visit (INDEPENDENT_AMBULATORY_CARE_PROVIDER_SITE_OTHER): Payer: BC Managed Care – PPO | Admitting: Family Medicine

## 2021-07-30 ENCOUNTER — Encounter: Payer: Self-pay | Admitting: Family Medicine

## 2021-07-30 VITALS — BP 114/76 | HR 91 | Temp 98.5°F | Ht 67.0 in | Wt 198.8 lb

## 2021-07-30 DIAGNOSIS — D170 Benign lipomatous neoplasm of skin and subcutaneous tissue of head, face and neck: Secondary | ICD-10-CM

## 2021-07-30 NOTE — Progress Notes (Signed)
Subjective:    Patient ID: Lori Blackwell, female    DOB: 1991/02/24, 30 y.o.   MRN: [id number]  Chief Complaint  Patient presents with   Abrasion    Bump on back    HPI Patient was seen today for ongoing concern.  Pt endorses a bump on midline upper back/base of neck.  Present x 1 yr, but causing discomfort x 6 months.  Pt thinks area has enlarged since having a baby.  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  Past Medical History:  Diagnosis Date   Allergy    Asthma    SEASONAL    Bronchitis    Chicken pox    Chlamydia    from prenatal   Complication of anesthesia    HARD TIME WAKING UP   Dysrhythmia    H/O IN COLLEGE-HAD EKG WHICH PT STATES WAS NORMAL-NO PROBLEMS SINCE   Heart murmur    BORN WITH HEART MURMUR-OUTGREW-ASYMPTOMATIC   HPV (human papilloma virus) infection    from prenatal   Kidney stone    PONV (postoperative nausea and vomiting)    Vaginal Pap smear, abnormal     Allergies  Allergen Reactions   Shellfish Allergy Anaphylaxis   Sulfa Antibiotics Rash    ROS General: Denies fever, chills, night sweats, changes in weight, changes in appetite HEENT: Denies headaches, ear pain, changes in vision, rhinorrhea, sore throat CV: Denies CP, palpitations, SOB, orthopnea Pulm: Denies SOB, cough, wheezing GI: Denies abdominal pain, nausea, vomiting, diarrhea, constipation GU: Denies dysuria, hematuria, frequency, vaginal discharge Msk: Denies muscle cramps, joint pains Neuro: Denies weakness, numbness, tingling Skin: Denies rashes, bruising  +bump on neck Psych: Denies depression, anxiety, hallucinations    Objective:    Blood pressure 114/76, pulse 91, temperature 98.5 F (36.9 C), height 5\' 7"  (1.702 m), weight 198 lb 12.8 oz (90.2 kg), SpO2 97 %, unknown if currently breastfeeding.  Gen. Pleasant, well-nourished, in no distress, normal affect   HEENT: [place]/AT, face symmetric, conjunctiva clear, no scleral icterus, PERRLA, EOMI, nares patent without drainage Lungs:  no accessory muscle use, Cardiovascular: RRR, no peripheral edema Musculoskeletal: No deformities, no cyanosis or clubbing, normal tone Neuro:  A&Ox3, CN II-XII intact, normal gait Skin:  Warm, dry, intact.  Posterior neck with soft, mobile lipomatous mass.   Wt Readings from Last 3 Encounters:  07/30/21 198 lb 12.8 oz (90.2 kg)  04/15/21 217 lb (98.4 kg)  05/08/19 150 lb (68 kg)    Lab Results  Component Value Date   WBC 17.9 (H) 04/16/2021   HGB 9.8 (L) 04/16/2021   HCT 29.7 (L) 04/16/2021   PLT 171 04/16/2021   GLUCOSE 91 02/08/2019   CHOL 194 03/01/2014   TRIG 104.0 03/01/2014   HDL 65.30 03/01/2014   LDLCALC 108 (H) 03/01/2014   ALT 16 02/08/2019   AST 18 02/08/2019   NA 136 02/08/2019   K 4.4 02/08/2019   CL 102 02/08/2019   CREATININE 1.05 02/08/2019   BUN 17 02/08/2019   CO2 27 02/08/2019   TSH 3.84 03/01/2014    Assessment/Plan:  Lipoma of neck -Discussed removal options -Given handout - Plan: Ambulatory referral to Dermatology  F/u prn  Grier Mitts, MD

## 2021-09-22 DIAGNOSIS — D170 Benign lipomatous neoplasm of skin and subcutaneous tissue of head, face and neck: Secondary | ICD-10-CM | POA: Diagnosis not present

## 2021-10-01 ENCOUNTER — Encounter: Payer: Self-pay | Admitting: Family Medicine

## 2021-10-01 ENCOUNTER — Ambulatory Visit (INDEPENDENT_AMBULATORY_CARE_PROVIDER_SITE_OTHER): Payer: BC Managed Care – PPO

## 2021-10-01 ENCOUNTER — Other Ambulatory Visit: Payer: Self-pay

## 2021-10-01 ENCOUNTER — Ambulatory Visit (INDEPENDENT_AMBULATORY_CARE_PROVIDER_SITE_OTHER): Payer: BC Managed Care – PPO | Admitting: Family Medicine

## 2021-10-01 VITALS — BP 100/74 | HR 110 | Temp 99.1°F | Ht 67.0 in | Wt 185.0 lb

## 2021-10-01 DIAGNOSIS — R6883 Chills (without fever): Secondary | ICD-10-CM

## 2021-10-01 DIAGNOSIS — R110 Nausea: Secondary | ICD-10-CM

## 2021-10-01 DIAGNOSIS — R053 Chronic cough: Secondary | ICD-10-CM

## 2021-10-01 DIAGNOSIS — R000 Tachycardia, unspecified: Secondary | ICD-10-CM

## 2021-10-01 DIAGNOSIS — R0602 Shortness of breath: Secondary | ICD-10-CM

## 2021-10-01 DIAGNOSIS — R52 Pain, unspecified: Secondary | ICD-10-CM

## 2021-10-01 LAB — POCT INFLUENZA A/B
Influenza A, POC: NEGATIVE
Influenza B, POC: NEGATIVE

## 2021-10-01 LAB — CBC WITH DIFFERENTIAL/PLATELET
Basophils Absolute: 0 10*3/uL (ref 0.0–0.1)
Basophils Relative: 0.2 % (ref 0.0–3.0)
Eosinophils Absolute: 0.1 10*3/uL (ref 0.0–0.7)
Eosinophils Relative: 0.5 % (ref 0.0–5.0)
HCT: 44.8 % (ref 36.0–46.0)
Hemoglobin: 14.8 g/dL (ref 12.0–15.0)
Lymphocytes Relative: 5.4 % — ABNORMAL LOW (ref 12.0–46.0)
Lymphs Abs: 0.9 10*3/uL (ref 0.7–4.0)
MCHC: 33 g/dL (ref 30.0–36.0)
MCV: 86.9 fl (ref 78.0–100.0)
Monocytes Absolute: 0.7 10*3/uL (ref 0.1–1.0)
Monocytes Relative: 4 % (ref 3.0–12.0)
Neutro Abs: 14.8 10*3/uL — ABNORMAL HIGH (ref 1.4–7.7)
Neutrophils Relative %: 89.9 % — ABNORMAL HIGH (ref 43.0–77.0)
Platelets: 245 10*3/uL (ref 150.0–400.0)
RBC: 5.16 Mil/uL — ABNORMAL HIGH (ref 3.87–5.11)
RDW: 14.2 % (ref 11.5–15.5)
WBC: 16.5 10*3/uL — ABNORMAL HIGH (ref 4.0–10.5)

## 2021-10-01 LAB — COMPREHENSIVE METABOLIC PANEL
ALT: 14 U/L (ref 0–35)
AST: 18 U/L (ref 0–37)
Albumin: 4.6 g/dL (ref 3.5–5.2)
Alkaline Phosphatase: 62 U/L (ref 39–117)
BUN: 20 mg/dL (ref 6–23)
CO2: 30 mEq/L (ref 19–32)
Calcium: 9.9 mg/dL (ref 8.4–10.5)
Chloride: 101 mEq/L (ref 96–112)
Creatinine, Ser: 1.03 mg/dL (ref 0.40–1.20)
GFR: 71.68 mL/min (ref >60.00)
Glucose, Bld: 95 mg/dL (ref 70–99)
Potassium: 4.7 mEq/L (ref 3.5–5.1)
Sodium: 138 mEq/L (ref 135–145)
Total Bilirubin: 0.5 mg/dL (ref 0.2–1.2)
Total Protein: 8 g/dL (ref 6.0–8.3)

## 2021-10-01 LAB — BRAIN NATRIURETIC PEPTIDE: Pro B Natriuretic peptide (BNP): 20 pg/mL (ref 0.0–100.0)

## 2021-10-01 LAB — TSH: TSH: 2.49 u[IU]/mL (ref 0.35–5.50)

## 2021-10-01 LAB — T4, FREE: Free T4: 0.79 ng/dL (ref 0.60–1.60)

## 2021-10-01 LAB — POC COVID19 BINAXNOW: SARS Coronavirus 2 Ag: NEGATIVE

## 2021-10-01 MED ORDER — ONDANSETRON HCL 4 MG PO TABS: 4.0000 mg | ORAL_TABLET | Freq: Three times a day (TID) | ORAL | 0 refills | Status: DC | PRN

## 2021-10-01 NOTE — Progress Notes (Signed)
Subjective:    Patient ID: Lori Blackwell, female    DOB: 04-Dec-1990, 31 y.o.   MRN: [id number]  Chief Complaint  Patient presents with   Shortness of Breath    Intermittently x5 months, initially noted while lying on her back 5 months ago after childbirth, worse today and especially noted with exertion, "rattling in the left lung"    Cough    Non-productive x2-3 months   Generalized Body Aches    Noted since 3am   Headache    Noted since 3am   Nausea    Noted since 3am    HPI Patient was seen today for several ongoing and acute concerns.  Patient endorses shortness of breath, chest pain with coughing, frontal headache, chills, body aches, nausea, decreased appetite since 3 AM this morning.  Patient endorses loose stools x3 days.  Patient notes shortness of breath ongoing x5 months since laying on her back during childbirth.  Patient typically notices the shortness of breath when laying down but has progressively become worse, occurring anytime.  Cough ongoing x2-3 months.  Patient denies sick contacts.  Denies fever, sore throat, ear pain/pressure, facial pain/pressure, wheezing.  Currently breast-feeding.  Has not tried any OTC medications for symptoms.  Tried inhaler.  Past Medical History:  Diagnosis Date   Allergy    Asthma    SEASONAL    Bronchitis    Chicken pox    Chlamydia    from prenatal   Complication of anesthesia    HARD TIME WAKING UP   Dysrhythmia    H/O IN COLLEGE-HAD EKG WHICH PT STATES WAS NORMAL-NO PROBLEMS SINCE   Heart murmur    BORN WITH HEART MURMUR-OUTGREW-ASYMPTOMATIC   HPV (human papilloma virus) infection    from prenatal   Kidney stone    PONV (postoperative nausea and vomiting)    Vaginal Pap smear, abnormal     Allergies  Allergen Reactions   Shellfish Allergy Anaphylaxis   Sulfa Antibiotics Rash    ROS General: Denies fever, chills, night sweats, changes in weight, changes in appetite +chills, body aches, decreased appetite HEENT:  Denies headaches, ear pain, changes in vision, rhinorrhea, sore throat +HA CV: Denies palpitations, orthopnea  +CP, SOB Pulm: Denies SOB, cough, wheezing +SOB, cough GI: Denies abdominal pain, vomiting, constipation +n, loose stools GU: Denies dysuria, hematuria, frequency, vaginal discharge Msk: Denies muscle cramps, joint pains Neuro: Denies weakness, numbness, tingling Skin: Denies rashes, bruising Psych: Denies depression, anxiety, hallucinations     Objective:    Blood pressure 100/74, pulse (!) 110, temperature 99.1 F (37.3 C), temperature source Axillary, height 5\' 7"  (1.702 m), weight 185 lb (83.9 kg), SpO2 97 %, currently breastfeeding.  Gen. Pleasant, well-nourished, appears ill but nontoxic, normal affect   HEENT: [place]/AT, face symmetric, conjunctiva clear, no scleral icterus, PERRLA, EOMI, nares patent without drainage, pharynx without erythema or exudate.  TMs full bilaterally.  No cervical lymphadenopathy. Lungs: no accessory muscle use, mildly decreased breath sounds in left lower base posteriorly, no wheezes or rales. Cardiovascular: Tachycardia, no m/r/g, no peripheral edema Musculoskeletal: No deformities, no cyanosis or clubbing, normal tone Neuro:  A&Ox3, CN II-XII intact, normal gait Skin:  Warm, no lesions/ rash   Wt Readings from Last 3 Encounters:  10/01/21 185 lb (83.9 kg)  07/30/21 198 lb 12.8 oz (90.2 kg)  04/15/21 217 lb (98.4 kg)    Lab Results  Component Value Date   WBC 17.9 (H) 04/16/2021   HGB 9.8 (L) 04/16/2021   HCT  29.7 (L) 04/16/2021   PLT 171 04/16/2021   GLUCOSE 91 02/08/2019   CHOL 194 03/01/2014   TRIG 104.0 03/01/2014   HDL 65.30 03/01/2014   LDLCALC 108 (H) 03/01/2014   ALT 16 02/08/2019   AST 18 02/08/2019   NA 136 02/08/2019   K 4.4 02/08/2019   CL 102 02/08/2019   CREATININE 1.05 02/08/2019   BUN 17 02/08/2019   CO2 27 02/08/2019   TSH 3.84 03/01/2014    Assessment/Plan:  Shortness of breath - Plan: POC COVID-19, POC  Influenza A/B, DG Chest 2 View, CBC with Differential/Platelet, CMP, TSH, T4, Free, Brain Natriuretic Peptide, D-dimer, Quantitative  Nausea  - Plan: POC COVID-19, POC Influenza A/B, CMP, TSH, T4, Free, ondansetron (ZOFRAN) 4 MG tablet  Chronic cough -Seasonal allergies may be contributing to chronic cough. -We will obtain labs and CXR to rule out PNA, - Plan: DG Chest 2 View, CBC with Differential/Platelet  Chills  - Plan: POC COVID-19, POC Influenza A/B, DG Chest 2 View, CBC with Differential/Platelet  Body aches  -Acute nasal pharyngitis versus influenza versus COVID -POC COVID testing and influenza testing negative this visit -Supportive care for symptoms with OTC medications -We will obtain labs and CXR - Plan: DG Chest 2 View, CBC with Differential/Platelet, CMP  Tachycardia -likely 2/2 dehydration. Also consider coughing and current illness as contributing factors -Discussed the importance of hydration  Current symptoms likely 2/2 viral illness such as acute nasopharyngitis, influenza, or COVID.  Will obtain COVID and flu testing in clinic.  Both COVID and flu testing were negative in clinic.  Consider repeating COVID testing in 2 days.  Given progressively worsened SOB x 5 months will obtain CXR to rule out PNA and labs to rule out PE.  As patient is currently breast-feeding discussed OTC medications for treatment of symptoms.   Zofran as needed for nausea.  Discussed the importance of hydration.Given strict precautions for worsening symptoms.  F/u prn  Grier Mitts, MD

## 2021-10-02 ENCOUNTER — Encounter: Payer: Self-pay | Admitting: Family Medicine

## 2021-10-02 LAB — D-DIMER, QUANTITATIVE: D-Dimer, Quant: <0.19 mcg/mL FEU (ref <0.50)

## 2021-10-05 ENCOUNTER — Encounter: Payer: Self-pay | Admitting: Emergency Medicine

## 2021-10-05 ENCOUNTER — Other Ambulatory Visit: Payer: Self-pay

## 2021-10-05 ENCOUNTER — Telehealth: Payer: Self-pay | Admitting: Family Medicine

## 2021-10-05 ENCOUNTER — Ambulatory Visit
Admission: EM | Admit: 2021-10-05 | Discharge: 2021-10-05 | Disposition: A | Discharge status: Home / Self Care | Payer: BC Managed Care – PPO | Attending: Emergency Medicine | Admitting: Emergency Medicine

## 2021-10-05 DIAGNOSIS — R0602 Shortness of breath: Secondary | ICD-10-CM

## 2021-10-05 MED ORDER — METHYLPREDNISOLONE SODIUM SUCC 125 MG IJ SOLR
125.0000 mg | Freq: Once | INTRAMUSCULAR | Status: AC
  Administered 2021-10-05: 125 mg via INTRAMUSCULAR

## 2021-10-05 MED ORDER — METHYLPREDNISOLONE 4 MG PO TBPK: ORAL_TABLET | ORAL | 0 refills | Status: DC

## 2021-10-05 NOTE — Telephone Encounter (Signed)
Spoke with pt, sent concerns to PCP through telephone encounter.

## 2021-10-05 NOTE — Telephone Encounter (Signed)
Patient is requesting a phone call back regarding labs.  Please advise.

## 2021-10-05 NOTE — ED Triage Notes (Signed)
For week been sick with cough, congestion, chills, aches and pains and diarrhea for about 4-5 days. Seen at PCP and had negative covid, flu, chest xray. Blood work did show little elevation in WBCs.  Since has a 9 month old wants testing for RSV and PCP doesn't do that.

## 2021-10-05 NOTE — Discharge Instructions (Addendum)
Your symptoms and physical exam findings are concerning for a viral respiratory infection.  You were tested for both RSV, COVID and influenza today, the result of your viral testing will be posted to your MyChart once it is complete, this typically takes 24 to 48 hours.  If there is a positive result, you will be contacted by phone with further recommendations, if any.    To address your shortness of breath, I recommend the following:   Methylprednisolone IM (Solu-Medrol):  To quickly address your significant respiratory inflammation, you were provided with an injection of methylprednisolone in the office today.  You should continue to feel the full benefit of the steroid for the next 4 to 6 hours.    Methylprednisolone (Medrol Dosepak): This is a steroid that will significantly calm your upper and lower airways, please take one row of tablets daily with your breakfast meal starting tomorrow morning until the prescription is complete.      Guaifenesin (Robitussin, Mucinex): This is an expectorant.  This helps break up chest congestion and loosen up thick nasal drainage making phlegm and drainage more liquid and therefore easier to remove.  I recommend being 400 mg three times daily as needed.        UpToDate: Methylprednisolone Breastfeeding Considerations  Methylprednisolone is present in breast milk (Boz 2018; Cooper 2015; Strijbos 2015; Doylene Canning 2020). Information related to the presence of methylprednisolone in breast milk is available from a study following maternal administration of methylprednisolone 1,000 mg IV infused over 2 hours. Using the highest milk concentration (5.55 mcg/mL), the estimated daily infant dose via breast milk was 0.8325 mg/kg/day, providing a relative infant dose (RID) of methylprednisolone of 2.8% to 5.6% compared to a weight-adjusted infant dose of 15 to 30 mg/kg/day. The maximum milk concentration occurred 1 hour after the maternal dose and methylprednisolone was  below the limits of quantification 12 hours after the dose Burt Knack 2015). In general, breastfeeding is considered acceptable when the RID is <10% Ouida Sills 2016; Dillon Bjork 2000). The manufacturer notes that when used systemically, maternal use of corticosteroids have the potential to cause adverse events in a breastfeeding infant (eg, growth suppression, interfere with endogenous corticosteroid production) and therefore recommends a decision be made whether to discontinue breastfeeding or to discontinue the drug, taking into account the importance of treatment to the mother. Corticosteroids are generally considered acceptable in patients who are breastfeeding when used in usual doses; however, monitoring of the breastfeeding infant is recommended [facility] 2002). Methylprednisolone is classified as a nonfluorinated corticosteroid; when systemic corticosteroids are needed in a lactating patient for rheumatic disorders, low doses of nonfluorinated corticosteroids are preferred (ACR [Sammaritano 2020]). If there is concern about exposure to the infant, waiting 2 to 4 hours after administration of methylprednisolone IV decreases exposure via breast milk (recommendation based on methylprednisolone pulse dosing for multiple sclerosis) (Cooper 2015; Doylene Canning 2020). In addition, some guidelines recommend waiting 4 hours after the maternal dose of an oral systemic corticosteroid before breastfeeding (based on a study using prednisolone) (ACR [Sammaritano 2020]; [place]).  To summarize, methylprednisolone will enter your breastmilk.  Steroids are generally acceptable when provided in regular doses.  If you are concerned about exposing her daughter to methylprednisolone, you can wait 2 to 4 hours after each dose before you breast-feed.    Thank you for visiting urgent care today.  We appreciate the opportunity to participate in your care.

## 2021-10-05 NOTE — ED Provider Notes (Signed)
[place]    CSN: [id number] Arrival date & time: 10/05/21  1519    HISTORY   Chief Complaint  Patient presents with   Cough   HPI Lori Blackwell is a 31 y.o. female. Patient complains of 1 week history of cough, congestion, chills, aches, pains and diarrhea.  Patient states her husband has had similar symptoms.  Patient states they have a 58-month-old daughter who is well at this time.  Patient states he is breast-feeding.  Patient states she was seen by her primary care provider the day her symptoms began, states COVID/flu test were both negative, CBC revealed an elevated white blood cell count.  Patient is asked to be tested for RSV today.  Patient states she has noticed that she becomes increasingly short of breath, particularly at nighttime.  Patient states she noticed also that it is difficult to completely exhale.  The history is provided by the patient.  Past Medical History:  Diagnosis Date   Allergy    Asthma    SEASONAL    Bronchitis    Chicken pox    Chlamydia    from prenatal   Complication of anesthesia    HARD TIME WAKING UP   Dysrhythmia    H/O IN COLLEGE-HAD EKG WHICH PT STATES WAS NORMAL-NO PROBLEMS SINCE   Heart murmur    BORN WITH HEART MURMUR-OUTGREW-ASYMPTOMATIC   HPV (human papilloma virus) infection    from prenatal   Kidney stone    PONV (postoperative nausea and vomiting)    Vaginal Pap smear, abnormal    Patient Active Problem List   Diagnosis Date Noted   Full-term premature rupture of membranes (PROM) with unknown onset of labor 04/15/2021   Irritable bowel syndrome with both constipation and diarrhea 01/19/2019   Acute upper respiratory infection 12/16/2015   ASCUS favoring benign 12/05/2015   Recurrent sinusitis 09/16/2015   Past Surgical History:  Procedure Laterality Date   APPENDECTOMY  2001   BREAST BIOPSY  2020   fibroabnormal   CESAREAN SECTION N/A 04/15/2021   Procedure: CESAREAN SECTION;  Surgeon: Jerelyn Charles,  MD;  Location: [facility];  Service: Obstetrics;  Laterality: N/A;   LEEP     NASAL SINUS SURGERY N/A 11/13/2015   Procedure: ENDOSCOPIC SINUS SURGERY;  Surgeon: Carloyn Manner, MD;  Location: [facility] ORS;  Service: ENT;  Laterality: N/A;   SEPTOPLASTY WITH ETHMOIDECTOMY, AND MAXILLARY ANTROSTOMY Bilateral 11/13/2015   Procedure: SEPTOPLASTY WITH ETHMOIDECTOMY, AND MAXILLARY ANTROSTOMY;  Surgeon: Carloyn Manner, MD;  Location: [facility] ORS;  Service: ENT;  Laterality: Bilateral;  bilateral maxillary antrostomy, left anterior ethmoidectomy, left frontal sinusotomy   SHOULDER SURGERY Right 2008   2011   TURBINATE REDUCTION Bilateral 11/13/2015   Procedure: TURBINATE REDUCTION;  Surgeon: Carloyn Manner, MD;  Location: [facility] ORS;  Service: ENT;  Laterality: Bilateral;   WISDOM TOOTH EXTRACTION     OB History     Gravida  1   Para  1   Term  1   Preterm      AB      Living  1      SAB      IAB      Ectopic      Multiple  0   Live Births  1          Home Medications    Prior to Admission medications   Medication Sig Start Date End Date Taking? Authorizing Provider  calcium carbonate (TUMS - DOSED IN MG ELEMENTAL  CALCIUM) 500 MG chewable tablet Chew 2-3 tablets by mouth daily as needed for indigestion or heartburn.    [provider]  ondansetron (ZOFRAN) 4 MG tablet Take 1 tablet (4 mg total) by mouth every 8 (eight) hours as needed for nausea or vomiting. 10/01/21   Billie Ruddy, MD  Prenatal Vit-Fe Fumarate-FA (PRENATAL VITAMIN PO) Take 1 tablet by mouth daily.    [provider]   Family History Family History  Problem Relation Age of Onset   Hyperlipidemia Mother    Healthy Father    Cancer Maternal Aunt        breast cancer   Cancer Maternal Grandmother        breast cancer   Colon cancer Neg Hx    Esophageal cancer Neg Hx    Social History Social History   Tobacco Use   Smoking status: Never   Smokeless tobacco: Never  Vaping Use    Vaping Use: Never used  Substance Use Topics   Alcohol use: Not Currently    Comment: 1-3 drinks weekly   Drug use: No   Allergies   Shellfish allergy and Sulfa antibiotics  Review of Systems Review of Systems Pertinent findings noted in history of present illness.   Physical Exam Triage Vital Signs ED Triage Vitals  Enc Vitals Group     BP 06/05/21 0827 (!) 147/82     Pulse Rate 06/05/21 0827 72     Resp 06/05/21 0827 18     Temp 06/05/21 0827 98.3 F (36.8 C)     Temp Source 06/05/21 0827 Oral     SpO2 06/05/21 0827 98 %     Weight --      Height --      Head Circumference --      Peak Flow --      Pain Score 06/05/21 0826 5     Pain Loc --      Pain Edu? --      Excl. in [place]? --   No data found.  Updated Vital Signs BP 108/73 (BP Location: Left Arm)    Pulse 64    Temp 97.9 F (36.6 C) (Oral)    Resp 18    SpO2 97%    Breastfeeding Yes   Physical Exam Vitals and nursing note reviewed.  Constitutional:      General: She is not in acute distress.    Appearance: Normal appearance. She is not ill-appearing.  HENT:     Head: Normocephalic and atraumatic.     Salivary Glands: Right salivary gland is not diffusely enlarged or tender. Left salivary gland is not diffusely enlarged or tender.     Right Ear: Tympanic membrane, ear canal and external ear normal. No drainage. No middle ear effusion. There is no impacted cerumen. Tympanic membrane is not erythematous or bulging.     Left Ear: Tympanic membrane, ear canal and external ear normal. No drainage.  No middle ear effusion. There is no impacted cerumen. Tympanic membrane is not erythematous or bulging.     Nose: Nose normal. No nasal deformity, septal deviation, mucosal edema, congestion or rhinorrhea.     Right Turbinates: Not enlarged, swollen or pale.     Left Turbinates: Not enlarged, swollen or pale.     Right Sinus: No maxillary sinus tenderness or frontal sinus tenderness.     Left Sinus: No maxillary sinus  tenderness or frontal sinus tenderness.     Mouth/Throat:     Lips:  Pink. No lesions.     Mouth: Mucous membranes are moist. No oral lesions.     Pharynx: Oropharynx is clear. Uvula midline. No posterior oropharyngeal erythema or uvula swelling.     Tonsils: No tonsillar exudate. 0 on the right. 0 on the left.  Eyes:     General: Lids are normal.        Right eye: No discharge.        Left eye: No discharge.     Extraocular Movements: Extraocular movements intact.     Conjunctiva/sclera: Conjunctivae normal.     Right eye: Right conjunctiva is not injected.     Left eye: Left conjunctiva is not injected.  Neck:     Trachea: Trachea and phonation normal.  Cardiovascular:     Rate and Rhythm: Normal rate and regular rhythm.     Pulses: Normal pulses.     Heart sounds: Normal heart sounds. No murmur heard.   No friction rub. No gallop.  Pulmonary:     Effort: Pulmonary effort is normal. No accessory muscle usage, prolonged expiration or respiratory distress.     Breath sounds: No stridor, decreased air movement or transmitted upper airway sounds. No decreased breath sounds, wheezing, rhonchi or rales.     Comments: Turbulent breath sounds throughout. Chest:     Chest wall: No tenderness.  Musculoskeletal:        General: Normal range of motion.     Cervical back: Normal range of motion and neck supple. Normal range of motion.  Lymphadenopathy:     Cervical: No cervical adenopathy.  Skin:    General: Skin is warm and dry.     Findings: No erythema or rash.  Neurological:     General: No focal deficit present.     Mental Status: She is alert and oriented to person, place, and time.  Psychiatric:        Mood and Affect: Mood normal.        Behavior: Behavior normal.    Visual Acuity Right Eye Distance:   Left Eye Distance:   Bilateral Distance:    Right Eye Near:   Left Eye Near:    Bilateral Near:     UC Couse / Diagnostics / Procedures:    EKG  Radiology No  results found.  Procedures Procedures (including critical care time)  UC Diagnoses / Final Clinical Impressions(s)   I have reviewed the triage vital signs and the nursing notes.  Pertinent labs & imaging results that were available during my care of the patient were reviewed by me and considered in my medical decision making (see chart for details).   Final diagnoses:  Shortness of breath   RSV testing performed at patient's request, COVID/flu testing also performed because I cannot order the separately.  We will notify patient of results once received.  Patient advised that I recommend methylprednisolone to relieve her shortness of breath, patient advised that while this will transmitted to her breastmilk it should be reasonably safe for her to take as long as she takes her dose immediately after breast-feeding and waits 4 hours till the next feed.  Return precautions advised..  ED Prescriptions     Medication Sig Dispense Auth. Provider   methylPREDNISolone (MEDROL DOSEPAK) 4 MG TBPK tablet Take 24 mg on day 1, 20 mg on day 2, 16 mg on day 3, 12 mg on day 4, 8 mg on day 5, 4 mg on day 6. 21 tablet Lynden Oxford Scales, PA-C  PDMP not reviewed this encounter.  Pending results:  Labs Reviewed  COVID-19, FLU A+B AND RSV    Medications Ordered in UC: Medications  methylPREDNISolone sodium succinate (SOLU-MEDROL) 125 mg/2 mL injection 125 mg (125 mg Intramuscular Given 10/05/21 1713)    Disposition Upon Discharge:  Condition: stable for discharge home Home: take medications as prescribed; routine discharge instructions as discussed; follow up as advised.  Patient presented with an acute illness with associated systemic symptoms and significant discomfort requiring urgent management. In my opinion, this is a condition that a prudent lay person (someone who possesses an average knowledge of health and medicine) may potentially expect to result in complications if not addressed  urgently such as respiratory distress, impairment of bodily function or dysfunction of bodily organs.   Routine symptom specific, illness specific and/or disease specific instructions were discussed with the patient and/or caregiver at length.   As such, the patient has been evaluated and assessed, work-up was performed and treatment was provided in alignment with urgent care protocols and evidence based medicine.  Patient/parent/caregiver has been advised that the patient may require follow up for further testing and treatment if the symptoms continue in spite of treatment, as clinically indicated and appropriate.  If the patient was tested for COVID-19, Influenza and/or RSV, then the patient/parent/guardian was advised to isolate at home pending the results of his/her diagnostic coronavirus test and potentially longer if theyre positive. I have also advised pt that if his/her COVID-19 test returns positive, it's recommended to self-isolate for at least 10 days after symptoms first appeared AND until fever-free for 24 hours without fever reducer AND other symptoms have improved or resolved. Discussed self-isolation recommendations as well as instructions for household member/close contacts as per the [facility] and [place] [facility], and also gave patient the [place] packet with this information.  Patient/parent/caregiver has been advised to return to the [facility] or PCP in 3-5 days if no better; to PCP or the Emergency Department if new signs and symptoms develop, or if the current signs or symptoms continue to change or worsen for further workup, evaluation and treatment as clinically indicated and appropriate  The patient will follow up with their current PCP if and as advised. If the patient does not currently have a PCP we will assist them in obtaining one.   The patient may need specialty follow up if the symptoms continue, in spite of conservative treatment and management, for further workup, evaluation, consultation  and treatment as clinically indicated and appropriate.  Patient/parent/caregiver verbalized understanding and agreement of plan as discussed.  All questions were addressed during visit.  Please see discharge instructions below for further details of plan.  Discharge Instructions:   Discharge Instructions      Your symptoms and physical exam findings are concerning for a viral respiratory infection.  You were tested for both RSV, COVID and influenza today, the result of your viral testing will be posted to your MyChart once it is complete, this typically takes 24 to 48 hours.  If there is a positive result, you will be contacted by phone with further recommendations, if any.    To address your shortness of breath, I recommend the following:   Methylprednisolone IM (Solu-Medrol):  To quickly address your significant respiratory inflammation, you were provided with an injection of methylprednisolone in the office today.  You should continue to feel the full benefit of the steroid for the next 4 to 6 hours.    Methylprednisolone (Medrol Dosepak): This is a  steroid that will significantly calm your upper and lower airways, please take one row of tablets daily with your breakfast meal starting tomorrow morning until the prescription is complete.      Guaifenesin (Robitussin, Mucinex): This is an expectorant.  This helps break up chest congestion and loosen up thick nasal drainage making phlegm and drainage more liquid and therefore easier to remove.  I recommend being 400 mg three times daily as needed.        UpToDate: Methylprednisolone Breastfeeding Considerations  Methylprednisolone is present in breast milk (Boz 2018; Cooper 2015; Strijbos 2015; Doylene Canning 2020). Information related to the presence of methylprednisolone in breast milk is available from a study following maternal administration of methylprednisolone 1,000 mg IV infused over 2 hours. Using the highest milk concentration  (5.55 mcg/mL), the estimated daily infant dose via breast milk was 0.8325 mg/kg/day, providing a relative infant dose (RID) of methylprednisolone of 2.8% to 5.6% compared to a weight-adjusted infant dose of 15 to 30 mg/kg/day. The maximum milk concentration occurred 1 hour after the maternal dose and methylprednisolone was below the limits of quantification 12 hours after the dose Burt Knack 2015). In general, breastfeeding is considered acceptable when the RID is <10% Ouida Sills 2016; Dillon Bjork 2000). The manufacturer notes that when used systemically, maternal use of corticosteroids have the potential to cause adverse events in a breastfeeding infant (eg, growth suppression, interfere with endogenous corticosteroid production) and therefore recommends a decision be made whether to discontinue breastfeeding or to discontinue the drug, taking into account the importance of treatment to the mother. Corticosteroids are generally considered acceptable in patients who are breastfeeding when used in usual doses; however, monitoring of the breastfeeding infant is recommended [facility] 2002). Methylprednisolone is classified as a nonfluorinated corticosteroid; when systemic corticosteroids are needed in a lactating patient for rheumatic disorders, low doses of nonfluorinated corticosteroids are preferred (ACR [Sammaritano 2020]). If there is concern about exposure to the infant, waiting 2 to 4 hours after administration of methylprednisolone IV decreases exposure via breast milk (recommendation based on methylprednisolone pulse dosing for multiple sclerosis) (Cooper 2015; Doylene Canning 2020). In addition, some guidelines recommend waiting 4 hours after the maternal dose of an oral systemic corticosteroid before breastfeeding (based on a study using prednisolone) (ACR [Sammaritano 2020]; [place]).  To summarize, methylprednisolone will enter your breastmilk.  Steroids are generally acceptable when provided in regular doses.  If you  are concerned about exposing her daughter to methylprednisolone, you can wait 2 to 4 hours after each dose before you breast-feed.    Thank you for visiting urgent care today.  We appreciate the opportunity to participate in your care.    This office note has been dictated using Museum/gallery curator.  Unfortunately, and despite my best efforts, this method of dictation can sometimes lead to occasional typographical or grammatical errors.  I apologize in advance if this occurs.     Lynden Oxford Scales, [place] 10/05/21 1814

## 2021-10-06 LAB — COVID-19, FLU A+B AND RSV
Influenza A, NAA: NOT DETECTED
Influenza B, NAA: NOT DETECTED
RSV, NAA: NOT DETECTED
SARS-CoV-2, NAA: NOT DETECTED

## 2021-10-12 NOTE — Telephone Encounter (Signed)
Late entry.  Recommendations previously communicated to [place]. ?

## 2021-12-25 DIAGNOSIS — C44519 Basal cell carcinoma of skin of other part of trunk: Secondary | ICD-10-CM | POA: Diagnosis not present

## 2021-12-25 DIAGNOSIS — D485 Neoplasm of uncertain behavior of skin: Secondary | ICD-10-CM | POA: Diagnosis not present

## 2021-12-25 DIAGNOSIS — C44619 Basal cell carcinoma of skin of left upper limb, including shoulder: Secondary | ICD-10-CM | POA: Diagnosis not present

## 2021-12-25 DIAGNOSIS — L821 Other seborrheic keratosis: Secondary | ICD-10-CM | POA: Diagnosis not present

## 2021-12-25 DIAGNOSIS — D225 Melanocytic nevi of trunk: Secondary | ICD-10-CM | POA: Diagnosis not present

## 2021-12-25 DIAGNOSIS — L814 Other melanin hyperpigmentation: Secondary | ICD-10-CM | POA: Diagnosis not present

## 2022-01-14 DIAGNOSIS — D170 Benign lipomatous neoplasm of skin and subcutaneous tissue of head, face and neck: Secondary | ICD-10-CM | POA: Diagnosis not present

## 2022-01-20 ENCOUNTER — Other Ambulatory Visit: Payer: Self-pay | Admitting: General Surgery

## 2022-01-20 DIAGNOSIS — C44619 Basal cell carcinoma of skin of left upper limb, including shoulder: Secondary | ICD-10-CM | POA: Diagnosis not present

## 2022-01-20 DIAGNOSIS — D170 Benign lipomatous neoplasm of skin and subcutaneous tissue of head, face and neck: Secondary | ICD-10-CM

## 2022-01-27 ENCOUNTER — Ambulatory Visit
Admission: RE | Admit: 2022-01-27 | Discharge: 2022-01-27 | Disposition: A | Discharge status: Home / Self Care | Payer: BC Managed Care – PPO | Source: Ambulatory Visit | Attending: General Surgery | Admitting: General Surgery

## 2022-01-27 DIAGNOSIS — D170 Benign lipomatous neoplasm of skin and subcutaneous tissue of head, face and neck: Secondary | ICD-10-CM

## 2022-02-17 DIAGNOSIS — O209 Hemorrhage in early pregnancy, unspecified: Secondary | ICD-10-CM | POA: Diagnosis not present

## 2022-02-17 DIAGNOSIS — Z6827 Body mass index (BMI) 27.0-27.9, adult: Secondary | ICD-10-CM | POA: Diagnosis not present

## 2022-02-17 DIAGNOSIS — Z3201 Encounter for pregnancy test, result positive: Secondary | ICD-10-CM | POA: Diagnosis not present

## 2022-03-09 DIAGNOSIS — Z3480 Encounter for supervision of other normal pregnancy, unspecified trimester: Secondary | ICD-10-CM | POA: Diagnosis not present

## 2022-03-09 DIAGNOSIS — O3680X9 Pregnancy with inconclusive fetal viability, other fetus: Secondary | ICD-10-CM | POA: Diagnosis not present

## 2022-03-09 DIAGNOSIS — Z113 Encounter for screening for infections with a predominantly sexual mode of transmission: Secondary | ICD-10-CM | POA: Diagnosis not present

## 2022-03-09 DIAGNOSIS — Z6827 Body mass index (BMI) 27.0-27.9, adult: Secondary | ICD-10-CM | POA: Diagnosis not present

## 2022-03-22 DIAGNOSIS — Z369 Encounter for antenatal screening, unspecified: Secondary | ICD-10-CM | POA: Diagnosis not present

## 2022-03-22 DIAGNOSIS — Z3480 Encounter for supervision of other normal pregnancy, unspecified trimester: Secondary | ICD-10-CM | POA: Diagnosis not present

## 2022-03-22 DIAGNOSIS — Z3481 Encounter for supervision of other normal pregnancy, first trimester: Secondary | ICD-10-CM | POA: Diagnosis not present

## 2022-03-22 LAB — OB RESULTS CONSOLE GC/CHLAMYDIA
Chlamydia: NEGATIVE
Neisseria Gonorrhea: NEGATIVE

## 2022-03-22 LAB — OB RESULTS CONSOLE ANTIBODY SCREEN: Antibody Screen: NEGATIVE

## 2022-03-22 LAB — OB RESULTS CONSOLE RPR: RPR: NONREACTIVE

## 2022-03-22 LAB — OB RESULTS CONSOLE HIV ANTIBODY (ROUTINE TESTING): HIV: NONREACTIVE

## 2022-03-22 LAB — HEPATITIS C ANTIBODY: HCV Ab: NEGATIVE

## 2022-03-22 LAB — OB RESULTS CONSOLE HEPATITIS B SURFACE ANTIGEN: Hepatitis B Surface Ag: NEGATIVE

## 2022-03-24 DIAGNOSIS — Z3480 Encounter for supervision of other normal pregnancy, unspecified trimester: Secondary | ICD-10-CM | POA: Diagnosis not present

## 2022-04-26 DIAGNOSIS — Z3482 Encounter for supervision of other normal pregnancy, second trimester: Secondary | ICD-10-CM | POA: Diagnosis not present

## 2022-04-26 DIAGNOSIS — Z23 Encounter for immunization: Secondary | ICD-10-CM | POA: Diagnosis not present

## 2022-05-18 DIAGNOSIS — Z363 Encounter for antenatal screening for malformations: Secondary | ICD-10-CM | POA: Diagnosis not present

## 2022-05-18 DIAGNOSIS — Z3A19 19 weeks gestation of pregnancy: Secondary | ICD-10-CM | POA: Diagnosis not present

## 2022-06-08 DIAGNOSIS — Z363 Encounter for antenatal screening for malformations: Secondary | ICD-10-CM | POA: Diagnosis not present

## 2022-06-08 DIAGNOSIS — Z3A22 22 weeks gestation of pregnancy: Secondary | ICD-10-CM | POA: Diagnosis not present

## 2022-06-24 ENCOUNTER — Ambulatory Visit
Admission: EM | Admit: 2022-06-24 | Discharge: 2022-06-24 | Disposition: A | Discharge status: Home / Self Care | Payer: BC Managed Care – PPO | Attending: Urgent Care | Admitting: Urgent Care

## 2022-06-24 DIAGNOSIS — J4520 Mild intermittent asthma, uncomplicated: Secondary | ICD-10-CM | POA: Diagnosis not present

## 2022-06-24 DIAGNOSIS — Z8616 Personal history of COVID-19: Secondary | ICD-10-CM

## 2022-06-24 DIAGNOSIS — B349 Viral infection, unspecified: Secondary | ICD-10-CM

## 2022-06-24 DIAGNOSIS — Z3A22 22 weeks gestation of pregnancy: Secondary | ICD-10-CM | POA: Diagnosis not present

## 2022-06-24 DIAGNOSIS — Z20828 Contact with and (suspected) exposure to other viral communicable diseases: Secondary | ICD-10-CM

## 2022-06-24 MED ORDER — ALBUTEROL SULFATE HFA 108 (90 BASE) MCG/ACT IN AERS: 1.0000 | INHALATION_SPRAY | Freq: Four times a day (QID) | RESPIRATORY_TRACT | 0 refills | Status: DC | PRN

## 2022-06-24 NOTE — ED Provider Notes (Signed)
[name]  Note:  This document was prepared using Systems analyst and may include unintentional dictation errors.  MRN: [id number] DOB: May 07, 1991  Subjective:   Lori Blackwell is a 31 y.o. female presenting for 4-day history of acute onset sinus congestion, sinus headache, shortness of breath, sinus drainage.  Patient had exposure to RSV from her daughter.  She is [redacted] weeks pregnant.  Did not get the RSV vaccination as she was not within the window to get it.  Has mild intermittent asthma, has an inhaler but believes it may have expired.  Patient had COVID-19 about 2 to 3 months ago.  She would like to have the RSV test.  No current facility-administered medications for this encounter.  Current Outpatient Medications:    famotidine (PEPCID) 10 MG tablet, Take 10 mg by mouth 2 (two) times daily., Disp: , Rfl:    calcium carbonate (TUMS - DOSED IN MG ELEMENTAL CALCIUM) 500 MG chewable tablet, Chew 2-3 tablets by mouth daily as needed for indigestion or heartburn., Disp: , Rfl:    methylPREDNISolone (MEDROL DOSEPAK) 4 MG TBPK tablet, Take 24 mg on day 1, 20 mg on day 2, 16 mg on day 3, 12 mg on day 4, 8 mg on day 5, 4 mg on day 6. (Patient not taking: Reported on 06/24/2022), Disp: 21 tablet, Rfl: 0   ondansetron (ZOFRAN) 4 MG tablet, Take 1 tablet (4 mg total) by mouth every 8 (eight) hours as needed for nausea or vomiting. (Patient not taking: Reported on 06/24/2022), Disp: 10 tablet, Rfl: 0   Prenatal Vit-Fe Fumarate-FA (PRENATAL VITAMIN PO), Take 1 tablet by mouth daily., Disp: , Rfl:    Allergies  Allergen Reactions   Shellfish Allergy Anaphylaxis   Sulfa Antibiotics Rash    Past Medical History:  Diagnosis Date   Allergy    Asthma    SEASONAL    Bronchitis    Chicken pox    Chlamydia    from prenatal   Complication of anesthesia    HARD TIME WAKING UP   Dysrhythmia    H/O IN COLLEGE-HAD EKG WHICH PT STATES WAS NORMAL-NO PROBLEMS  SINCE   Heart murmur    BORN WITH HEART MURMUR-OUTGREW-ASYMPTOMATIC   HPV (human papilloma virus) infection    from prenatal   Kidney stone    PONV (postoperative nausea and vomiting)    Vaginal Pap smear, abnormal      Past Surgical History:  Procedure Laterality Date   APPENDECTOMY  2001   BREAST BIOPSY  2020   fibroabnormal   CESAREAN SECTION N/A 04/15/2021   Procedure: CESAREAN SECTION;  Surgeon: Jerelyn Charles, MD;  Location: [facility];  Service: Obstetrics;  Laterality: N/A;   LEEP     NASAL SINUS SURGERY N/A 11/13/2015   Procedure: ENDOSCOPIC SINUS SURGERY;  Surgeon: Carloyn Manner, MD;  Location: [facility] ORS;  Service: ENT;  Laterality: N/A;   SEPTOPLASTY WITH ETHMOIDECTOMY, AND MAXILLARY ANTROSTOMY Bilateral 11/13/2015   Procedure: SEPTOPLASTY WITH ETHMOIDECTOMY, AND MAXILLARY ANTROSTOMY;  Surgeon: Carloyn Manner, MD;  Location: [facility] ORS;  Service: ENT;  Laterality: Bilateral;  bilateral maxillary antrostomy, left anterior ethmoidectomy, left frontal sinusotomy   SHOULDER SURGERY Right 2008   2011   TURBINATE REDUCTION Bilateral 11/13/2015   Procedure: TURBINATE REDUCTION;  Surgeon: Carloyn Manner, MD;  Location: [facility] ORS;  Service: ENT;  Laterality: Bilateral;   WISDOM TOOTH EXTRACTION      Family History  Problem Relation Age of Onset  Hyperlipidemia Mother    Healthy Father    Cancer Maternal Aunt        breast cancer   Cancer Maternal Grandmother        breast cancer   Colon cancer Neg Hx    Esophageal cancer Neg Hx     Social History   Tobacco Use   Smoking status: Never   Smokeless tobacco: Never  Vaping Use   Vaping Use: Never used  Substance Use Topics   Alcohol use: Not Currently    Comment: 1-3 drinks weekly   Drug use: No    ROS   Objective:   Vitals: BP 119/80 (BP Location: Right Arm)   Pulse 87   Temp 98.1 F (36.7 C) (Oral)   Resp 14   SpO2 96%   Physical Exam Constitutional:      General: She is not in acute distress.     Appearance: Normal appearance. She is well-developed. She is not ill-appearing, toxic-appearing or diaphoretic.  HENT:     Head: Normocephalic and atraumatic.     Right Ear: External ear normal.     Left Ear: External ear normal.     Nose: Congestion present. No rhinorrhea.     Mouth/Throat:     Mouth: Mucous membranes are moist.     Pharynx: No oropharyngeal exudate or posterior oropharyngeal erythema.     Comments: Thick postnasal drainage overlying pharynx. Eyes:     General: No scleral icterus.       Right eye: No discharge.        Left eye: No discharge.     Extraocular Movements: Extraocular movements intact.  Cardiovascular:     Rate and Rhythm: Normal rate and regular rhythm.     Pulses: No decreased pulses.     Heart sounds: Normal heart sounds. No murmur heard.    No friction rub. No gallop.  Pulmonary:     Effort: Pulmonary effort is normal. No respiratory distress.     Breath sounds: No stridor. No decreased breath sounds, wheezing, rhonchi or rales.  Chest:     Chest wall: No tenderness.  Skin:    General: Skin is warm and dry.  Neurological:     General: No focal deficit present.     Mental Status: She is alert and oriented to person, place, and time.  Psychiatric:        Mood and Affect: Mood normal.        Behavior: Behavior normal.     Assessment and Plan :   PDMP not reviewed this encounter.  1. Acute viral syndrome   2. RSV exposure   3. [redacted] weeks gestation of pregnancy   4. Mild intermittent asthma without complication   5. History of COVID-19     Respiratory panel pending. Will manage for viral illness such as viral URI, viral syndrome, viral rhinitis, COVID-19, influenza, RSV. Recommended supportive care. Offered scripts for symptomatic relief. Testing is pending. Counseled patient on potential for adverse effects with medications prescribed/recommended today, ER and return-to-clinic precautions discussed, patient verbalized understanding.      Jaynee Eagles, [place] 06/24/22 1913

## 2022-06-24 NOTE — Discharge Instructions (Addendum)
We will notify you of your test results as they arrive and may take between about 24 hours.  I encourage you to sign up for MyChart if you have not already done so as this can be the easiest way for [place] to communicate results to you online or through a phone app.  Generally, we only contact you if it is a positive test result.  In the meantime, if you develop worsening symptoms including fever, chest pain, shortness of breath despite our current treatment plan then please report to the emergency room as this may be a sign of worsening status from possible viral infection.  Otherwise, we will manage this as a viral syndrome. For sore throat or cough try using a honey-based tea. Use 3 teaspoons of honey with juice squeezed from half lemon. Place shaved pieces of ginger into 1/2-1 cup of water and warm over stove top. Then mix the ingredients and repeat every 4 hours as needed. Please take Tylenol '500mg'$ -'650mg'$  every 6 hours for aches and pains, fevers. Hydrate very well with at least 2 liters of water. Eat light meals such as soups to replenish electrolytes and soft fruits, veggies. Start an antihistamine like Zyrtec for postnasal drainage, sinus congestion.  You can take this together with the albuterol inhaler. You can use non-alcohol based cough drops over-the-counter, Zinc '20mg'$  once daily.

## 2022-06-24 NOTE — ED Triage Notes (Signed)
Pt presents with c/o HA, congestion, sob that began Monday. Daughter tested positive for RSV last week. Pt is [redacted] weeks pregnant.

## 2022-06-25 ENCOUNTER — Telehealth: Payer: Self-pay

## 2022-06-25 LAB — RESP PANEL BY RT-PCR (RSV, FLU A&B, COVID)  RVPGX2
Influenza A by PCR: NEGATIVE
Influenza B by PCR: NEGATIVE
Resp Syncytial Virus by PCR: POSITIVE — AB
SARS Coronavirus 2 by RT PCR: NEGATIVE

## 2022-06-25 NOTE — Telephone Encounter (Signed)
Patient verification complete (name and date of birth).   Patient called requesting results of respiratory panel and pt made aware that the results are still pending. All questions answered.

## 2022-07-07 DIAGNOSIS — Z369 Encounter for antenatal screening, unspecified: Secondary | ICD-10-CM | POA: Diagnosis not present

## 2022-07-07 DIAGNOSIS — Z3480 Encounter for supervision of other normal pregnancy, unspecified trimester: Secondary | ICD-10-CM | POA: Diagnosis not present

## 2022-07-23 DIAGNOSIS — Z23 Encounter for immunization: Secondary | ICD-10-CM | POA: Diagnosis not present

## 2022-08-03 DIAGNOSIS — O368199 Decreased fetal movements, unspecified trimester, other fetus: Secondary | ICD-10-CM | POA: Diagnosis not present

## 2022-08-03 DIAGNOSIS — R100 Acute abdomen: Secondary | ICD-10-CM | POA: Diagnosis not present

## 2022-08-04 ENCOUNTER — Ambulatory Visit: Payer: BC Managed Care – PPO | Attending: Obstetrics | Admitting: Physical Therapy

## 2022-08-04 DIAGNOSIS — R269 Unspecified abnormalities of gait and mobility: Secondary | ICD-10-CM | POA: Insufficient documentation

## 2022-08-04 DIAGNOSIS — M62838 Other muscle spasm: Secondary | ICD-10-CM | POA: Diagnosis not present

## 2022-08-04 DIAGNOSIS — M6281 Muscle weakness (generalized): Secondary | ICD-10-CM | POA: Diagnosis not present

## 2022-08-04 DIAGNOSIS — R293 Abnormal posture: Secondary | ICD-10-CM | POA: Insufficient documentation

## 2022-08-04 DIAGNOSIS — R279 Unspecified lack of coordination: Secondary | ICD-10-CM | POA: Insufficient documentation

## 2022-08-04 NOTE — Therapy (Addendum)
OUTPATIENT PHYSICAL THERAPY FEMALE PELVIC EVALUATION   Patient Name: Lori Blackwell MRN: [id number] DOB:23-Sep-1990, 31 y.o., female Today's Date: 08/04/2022  END OF SESSION:  PT End of Session - 08/04/22 1524     Visit Number 1    Date for PT Re-Evaluation 09/15/22    Authorization Type BCBS    PT Start Time 1445    PT Stop Time 1527    PT Time Calculation (min) 42 min    Activity Tolerance Patient tolerated treatment well    Behavior During Therapy WFL for tasks assessed/performed             Past Medical History:  Diagnosis Date   Allergy    Asthma    SEASONAL    Bronchitis    Chicken pox    Chlamydia    from prenatal   Complication of anesthesia    HARD TIME WAKING UP   Dysrhythmia    H/O IN COLLEGE-HAD EKG WHICH PT [place]   Heart murmur    BORN WITH HEART MURMUR-OUTGREW-ASYMPTOMATIC   HPV (human papilloma virus) infection    from prenatal   Kidney stone    PONV (postoperative nausea and vomiting)    Vaginal Pap smear, abnormal    Past Surgical History:  Procedure Laterality Date   APPENDECTOMY  2001   BREAST BIOPSY  2020   fibroabnormal   CESAREAN SECTION N/A 04/15/2021   Procedure: CESAREAN SECTION;  Surgeon: Jerelyn Charles, MD;  Location: [place] [facility];  Service: Obstetrics;  Laterality: N/A;   LEEP     NASAL SINUS SURGERY N/A 11/13/2015   Procedure: ENDOSCOPIC SINUS SURGERY;  Surgeon: Carloyn Manner, MD;  Location: [facility];  Service: ENT;  Laterality: N/A;   SEPTOPLASTY WITH ETHMOIDECTOMY, AND MAXILLARY ANTROSTOMY Bilateral 11/13/2015   Procedure: SEPTOPLASTY WITH ETHMOIDECTOMY, AND MAXILLARY ANTROSTOMY;  Surgeon: Carloyn Manner, MD;  Location: [facility];  Service: ENT;  Laterality: Bilateral;  bilateral maxillary antrostomy, left anterior ethmoidectomy, left frontal sinusotomy   SHOULDER SURGERY Right 2008   2011   TURBINATE REDUCTION Bilateral 11/13/2015   Procedure: TURBINATE REDUCTION;  Surgeon: Carloyn Manner, MD;   Location: [facility];  Service: ENT;  Laterality: Bilateral;   WISDOM TOOTH EXTRACTION     Patient Active Problem List   Diagnosis Date Noted   Full-term premature rupture of membranes (PROM) with unknown onset of labor 04/15/2021   Irritable bowel syndrome with both constipation and diarrhea 01/19/2019   Acute upper respiratory infection 12/16/2015   ASCUS favoring benign 12/05/2015   Recurrent sinusitis 09/16/2015    PCP: Gatha Mayer, MD  REFERRING PROVIDER: Jerelyn Charles, MD  REFERRING DIAG: M89.9 (ICD-10-CM) - Disorder of bone, unspecified  THERAPY DIAG:  Muscle weakness (generalized)  Other muscle spasm  Abnormal posture  Unspecified lack of coordination  Abnormality of gait and mobility  Rationale for Evaluation and Treatment: Rehabilitation  ONSET DATE: 08/02/23  SUBJECTIVE:  SUBJECTIVE STATEMENT: Started having a lot of pain below her c-section scar on Christmas eve. OB yesterday - had a lot of pain at pubic symphysis. Pain but much worse with rolling, stairs, walking, bending over and most of the time Rt feels worse than Lt.    PAIN:  Are you having pain? Yes NPRS scale: 8/10 Pain location:  pubic bone  Pain type: sharp Pain description: intermittent   Aggravating factors: rolling, stairs, walking, bending over and most of the time Rt feels worse than Lt Relieving factors: sitting, laying  PRECAUTIONS: Other: [redacted] weeks pregnant   WEIGHT BEARING RESTRICTIONS: No  FALLS:  Has patient fallen in last 6 months? No  LIVING ENVIRONMENT: Lives with: lives with their family Lives in: House/apartment  OCCUPATION: Lawyer   PLOF: Independent  PATIENT GOALS: to have less pain   PERTINENT HISTORY:  [redacted]weeks pregnant, pubic symphysis dysfunction  C-sectoin  04/15/21, LEEP 01/23/16 (-),  Sexual abuse: No  BOWEL MOVEMENT: Pain with bowel movement: No Type of bowel movement:Type (Bristol Stool Scale) 4, Frequency daily, and Strain No Fully empty rectum: Yes:   Leakage: No Pads: No Fiber supplement: No  URINATION: Pain with urination: No Fully empty bladder: Yes:   Stream: Strong Urgency: No Frequency: increased with pregnancy  Leakage:  no Pads: No  INTERCOURSE: Pain with intercourse:  not painful  Ability to have vaginal penetration:  Yes:   Climax: not painful  Marinoff Scale: 0/3  PREGNANCY: Vaginal deliveries 0 Tearing No C-section deliveries 0 Currently pregnant Yes: 30 weeks  PROLAPSE: None   OBJECTIVE:   DIAGNOSTIC FINDINGS:    COGNITION: Overall cognitive status: Within functional limits for tasks assessed     SENSATION: Light touch: Appears intact Proprioception: Appears intact  MUSCLE LENGTH: Bil hamstrings and adductors limited by 25%   POSTURE: rounded shoulders, forward head, and anterior pelvic tilt    LUMBARAROM/PROM:  A/PROM A/PROM  eval  Flexion Limited by 25%  Extension wfl  Right lateral flexion WFL  Left lateral flexion WFL  Right rotation Limited by 25%   Left rotation Limited by 25%   (Blank rows = not tested)  LOWER EXTREMITY ROM:  WFL  LOWER EXTREMITY MMT:  Bil hip abduction 4/5, all other directions 4+/5 did have pain at pubic symphysis with adduction and extension and flexion testing; knees and ankles 5/5  PALPATION:   General  TTP at pubic bone and symphysis; bil glute med but Rt>Lt,                 External Perineal Exam deferred                              Internal Pelvic Floor deferred   Patient confirms identification and approves PT to assess internal pelvic floor and treatment No  PELVIC MMT:   MMT eval  Vaginal   Internal Anal Sphincter   External Anal Sphincter   Puborectalis   Diastasis Recti   (Blank rows = not tested)        TONE: Deferred    PROLAPSE: Deferred   TODAY'S TREATMENT:  DATE:   08/04/22 EVAL Examination completed, findings reviewed, pt educated on POC, HEP, and taping at abdomen for improved support and decreased pain. 5 pieces of k-tape at abdomen, pt denied allergies to adhesives, educated on how/when to remove tape and to remove with any skin irritation.  Pt motivated to participate in PT and agreeable to attempt recommendations.    Pt educated on pregnancy precautions including but not limited to vaginal bleeding, contractions, leaking of fluid and fetal movement. If any of these occur halt activity and please consult OBGYN for recommended care.     PATIENT EDUCATION:  Education details: [id number] Person educated: Patient Education method: Explanation, Demonstration, Tactile cues, Verbal cues, and Handouts Education comprehension: verbalized understanding and returned demonstration  HOME EXERCISE PROGRAM: [id number]  ASSESSMENT:  CLINICAL IMPRESSION: Patient is a 31 y.o. female  who was seen today for physical therapy evaluation and treatment for pubic pain with pregnancy. Pt found to have decreased flexibility in hips and spine, TTP at pubic bone and symphysis,  tightness and TTP at bil glute med, mild bil hip weakness, limited with pain at pubic symphysis. Pain made worse with transitional motions, rolling, stairs, car transfers, single leg activity. PT provided taping at abdomen for improved support and decreased pain with good effect and pt reported she felt more supported as well. Pt also educated on belly band support. Pt also educated on log rolling and gentle TA activation with transfers and marching/single leg stance activities to decreased pain. Pt reported this did lower pain. Pt would benefit from additional PT to further address deficits.    OBJECTIVE IMPAIRMENTS: decreased  activity tolerance, decreased coordination, decreased endurance, decreased mobility, difficulty walking, increased fascial restrictions, increased muscle spasms, impaired flexibility, improper body mechanics, postural dysfunction, and pain.   ACTIVITY LIMITATIONS: bending, standing, squatting, stairs, bed mobility, dressing, and locomotion level  PARTICIPATION LIMITATIONS: interpersonal relationship and community activity  PERSONAL FACTORS: Fitness, Past/current experiences, Time since onset of injury/illness/exacerbation, and 1 comorbidity: pregnant, first baby 72 months old, previous c-section  are also affecting patient's functional outcome.   REHAB POTENTIAL: Good  CLINICAL DECISION MAKING: Stable/uncomplicated  EVALUATION COMPLEXITY: Low   GOALS: Goals reviewed with patient? Yes  SHORT TERM GOALS: Target date: 09/01/22  Pt to be I with HEP.  Baseline: Goal status: INITIAL  2.  Pt will report no more than 6/10 pain at pelvis due to improvements in posture, strength, and muscle length  Baseline:  Goal status: INITIAL  3.  Pt to be I with breathing mechanics and core activation with body weight squats for improved pelvic stability and decreased pain.  Baseline:  Goal status: INITIAL   LONG TERM GOALS: Target date: 09/15/22  Pt to be I with advanced HEP.  Baseline:  Goal status: INITIAL  2.  Pt to be I or able to verbalize how to don tape for helper to don tape for improved pain management.  Baseline:  Goal status: INITIAL  3.  Pt will report no more than 4/10 pain at pelvis due to improvements in posture, strength, and muscle length  Baseline:  Goal status: INITIAL  4.  Pt to be I with breathing mechanics and core activation with 10#  squats for improved pelvic stability and decreased pain.  Baseline:  Goal status: INITIAL  5.  Pt to demonstrate at least 5/5 bil hip strength for improved pelvic stability and functional squats without leakage.  Baseline:  Goal  status: INITIAL   PLAN:  PT FREQUENCY: 1-2x/week  PT  DURATION: 6 weeks  PLANNED INTERVENTIONS: Therapeutic exercises, Therapeutic activity, Neuromuscular re-education, Patient/Family education, Self Care, Joint mobilization, Aquatic Therapy, Dry Needling, Spinal mobilization, Cryotherapy, Moist heat, scar mobilization, Taping, and Manual therapy  PLAN FOR NEXT SESSION: opp glute/lat activation strengthening, TA activation, trunk rotation mobility, hip strengthening, posture strengthening, possible dry needling at glute meds?, pelvic stability. (Anterior pelvic pain template on med bridge if needed)   Otelia Sergeant, PT, DPT 12/27/233:40 PM   PHYSICAL THERAPY DISCHARGE SUMMARY  Visits from Start of Care: 1  Current functional level related to goals / functional outcomes: Unable to formally reassess as pt did not return after eval   Remaining deficits: Unable to formally reassess as pt did not return after eval   Education / Equipment: HEP   Patient agrees to discharge. Patient goals were not met. Patient is being discharged due to not returning since the last visit.  Otelia Sergeant, PT, DPT 04/15/243:45 PM

## 2022-08-06 ENCOUNTER — Ambulatory Visit: Payer: BC Managed Care – PPO | Admitting: Physical Therapy

## 2022-08-17 ENCOUNTER — Encounter: Payer: BC Managed Care – PPO | Admitting: Physical Therapy

## 2022-08-17 DIAGNOSIS — Z369 Encounter for antenatal screening, unspecified: Secondary | ICD-10-CM | POA: Diagnosis not present

## 2022-08-18 ENCOUNTER — Encounter: Payer: BC Managed Care – PPO | Admitting: Physical Therapy

## 2022-08-25 ENCOUNTER — Ambulatory Visit: Payer: BC Managed Care – PPO | Admitting: Physical Therapy

## 2022-09-01 ENCOUNTER — Encounter: Payer: BC Managed Care – PPO | Admitting: Physical Therapy

## 2022-09-01 DIAGNOSIS — O368139 Decreased fetal movements, third trimester, other fetus: Secondary | ICD-10-CM | POA: Diagnosis not present

## 2022-09-05 ENCOUNTER — Inpatient Hospital Stay (HOSPITAL_COMMUNITY)
Admission: AD | Admit: 2022-09-05 | Discharge: 2022-09-05 | Disposition: A | Discharge status: Home / Self Care | Payer: BC Managed Care – PPO | Attending: Obstetrics | Admitting: Obstetrics

## 2022-09-05 ENCOUNTER — Other Ambulatory Visit: Payer: Self-pay

## 2022-09-05 ENCOUNTER — Encounter (HOSPITAL_COMMUNITY): Payer: Self-pay | Admitting: Obstetrics

## 2022-09-05 DIAGNOSIS — Z0371 Encounter for suspected problem with amniotic cavity and membrane ruled out: Secondary | ICD-10-CM

## 2022-09-05 DIAGNOSIS — O23593 Infection of other part of genital tract in pregnancy, third trimester: Secondary | ICD-10-CM | POA: Diagnosis not present

## 2022-09-05 DIAGNOSIS — Z3A35 35 weeks gestation of pregnancy: Secondary | ICD-10-CM | POA: Insufficient documentation

## 2022-09-05 DIAGNOSIS — B3731 Acute candidiasis of vulva and vagina: Secondary | ICD-10-CM | POA: Diagnosis not present

## 2022-09-05 LAB — POCT FERN TEST

## 2022-09-05 MED ORDER — TERCONAZOLE 0.4 % VA CREA: 1.0000 | TOPICAL_CREAM | Freq: Every day | VAGINAL | 0 refills | Status: DC

## 2022-09-05 NOTE — MAU Provider Note (Signed)
  S: Ms. Lori Blackwell is a 32 y.o. G2P1001 at [redacted]w[redacted]d who presents to MAU today complaining of leaking of fluid since last night. She denies vaginal bleeding. She denies contractions. She reports normal fetal movement.    O: BP 108/67 (BP Location: Left Arm)   Pulse 97   Temp 98.2 F (36.8 C) (Oral)   Resp 18   Ht '5\' 7"'$  (1.702 m)   Wt 94.1 kg   SpO2 99%   BMI 32.50 kg/m  GENERAL: Well-developed, well-nourished female in no acute distress.  HEAD: Normocephalic, atraumatic.  CHEST: Normal effort of breathing, regular heart rate ABDOMEN: Soft, nontender, gravid PELVIC: Normal external female genitalia. Vagina is pink and rugated. Cervix with normal contour, no lesions. Copious thick, white adherent discharge.  No pooling.   Fetal Monitoring: Baseline: 120 Variability: moderate Accelerations: 15x15 Decelerations: none Contractions: none  Results for orders placed or performed during the hospital encounter of 09/05/22 (from the past 24 hour(s))  Fern Test     Status: None   Collection Time: 09/05/22 10:53 AM  Result Value Ref Range   [facility]    FPirtlevillenegative x2  A: 1. Encounter for suspected premature rupture of amniotic membranes, with rupture of membranes not found   2. Candidiasis, vagina   3. [redacted] weeks gestation of pregnancy      P: -Discharge home in stable condition -Rx for terazol sent to pharmacy -Third trimester precautions discussed -Patient advised to follow-up with OB as scheduled for prenatal care -Patient may return to MAU as needed or if her condition were to change or worsen    CWende Mott CNM 09/05/2022, 10:38 AM

## 2022-09-05 NOTE — MAU Note (Signed)
Vidhi Delellis is a 32 y.o. at 30w5dhere in MAU reporting: she began leaking fluid last night 2 2230.  Reports noted LOF clear fluid again this morning.  Denies VB.  Reports +FM. Also reports having 2 episodes of diarrhea this morning. LMP: NA Onset of complaint: last night Pain score: 3 There were no vitals filed for this visit.   FHT:135 bpm Lab orders placed from triage:   None

## 2022-09-05 NOTE — Discharge Instructions (Signed)

## 2022-09-09 ENCOUNTER — Encounter: Payer: BC Managed Care – PPO | Admitting: Physical Therapy

## 2022-09-14 ENCOUNTER — Ambulatory Visit: Payer: BC Managed Care – PPO

## 2022-09-16 DIAGNOSIS — Z369 Encounter for antenatal screening, unspecified: Secondary | ICD-10-CM | POA: Diagnosis not present

## 2022-09-16 DIAGNOSIS — Z3A36 36 weeks gestation of pregnancy: Secondary | ICD-10-CM | POA: Diagnosis not present

## 2022-09-16 DIAGNOSIS — O368990 Maternal care for other specified fetal problems, unspecified trimester, not applicable or unspecified: Secondary | ICD-10-CM | POA: Diagnosis not present

## 2022-09-17 DIAGNOSIS — Z3480 Encounter for supervision of other normal pregnancy, unspecified trimester: Secondary | ICD-10-CM | POA: Diagnosis not present

## 2022-09-17 LAB — OB RESULTS CONSOLE GBS: GBS: NEGATIVE

## 2022-09-22 DIAGNOSIS — Z369 Encounter for antenatal screening, unspecified: Secondary | ICD-10-CM | POA: Diagnosis not present

## 2022-09-23 DIAGNOSIS — Z369 Encounter for antenatal screening, unspecified: Secondary | ICD-10-CM | POA: Diagnosis not present

## 2022-09-23 DIAGNOSIS — O368990 Maternal care for other specified fetal problems, unspecified trimester, not applicable or unspecified: Secondary | ICD-10-CM | POA: Diagnosis not present

## 2022-09-27 NOTE — Patient Instructions (Signed)
Lori Blackwell  09/27/2022   Your procedure is scheduled on:  10/08/2022  Arrive at 58 at Entrance C on [address] at [facility]  and [employer] [employer] [address]. You are invited to use the FREE valet parking or use the Visitor's parking deck.  Pick up the phone at the desk and dial [phone].  Call this number if you have problems the morning of surgery: [phone]  Remember:   Do not eat food:(After Midnight) Desps de medianoche.  Do not drink clear liquids: (After Midnight) Desps de medianoche.  Take these medicines the morning of surgery with A SIP OF WATER:  none   Do not wear jewelry, make-up or nail polish.  Do not wear lotions, powders, or perfumes. Do not wear deodorant.  Do not shave 48 hours prior to surgery.  Do not bring valuables to the hospital.  [facility] is not   responsible for any belongings or valuables brought to the hospital.  Contacts, dentures or bridgework may not be worn into surgery.  Leave suitcase in the car. After surgery it may be brought to your room.  For patients admitted to the hospital, checkout time is 11:00 AM the day of              discharge.      Please read over the following fact sheets that you were given:     Preparing for Surgery

## 2022-09-28 ENCOUNTER — Encounter (HOSPITAL_COMMUNITY): Payer: Self-pay

## 2022-09-30 DIAGNOSIS — Z369 Encounter for antenatal screening, unspecified: Secondary | ICD-10-CM | POA: Diagnosis not present

## 2022-10-05 NOTE — Anesthesia Preprocedure Evaluation (Addendum)
Anesthesia Evaluation  Patient identified by MRN, date of birth, ID band Patient awake    Reviewed: Allergy & Precautions, NPO status , Patient's Chart, lab work & pertinent test results  History of Anesthesia Complications (+) PONV, PROLONGED EMERGENCE and history of anesthetic complications  Airway Mallampati: II  TM Distance: >3 FB Neck ROM: Full    Dental no notable dental hx. (+) Teeth Intact, Dental Advisory Given   Pulmonary asthma (well controlled)    Pulmonary exam normal breath sounds clear to auscultation       Cardiovascular Normal cardiovascular exam(-) dysrhythmias  Rhythm:Regular Rate:Normal     Neuro/Psych negative neurological ROS  negative psych ROS   GI/Hepatic negative GI ROS, Neg liver ROS,,,  Endo/Other  negative endocrine ROS    Renal/GU Renal disease  negative genitourinary   Musculoskeletal negative musculoskeletal ROS (+)    Abdominal   Peds  Hematology negative hematology ROS (+)   Anesthesia Other Findings   Reproductive/Obstetrics (+) Pregnancy Prior section 2022                             Anesthesia Physical Anesthesia Plan  ASA: 2  Anesthesia Plan: Spinal   Post-op Pain Management: Regional block, Toradol IV (intra-op)* and Ofirmev IV (intra-op)*   Induction:   PONV Risk Score and Plan: 3 and Ondansetron, Dexamethasone and Treatment may vary due to age or medical condition  Airway Management Planned: Natural Airway and Nasal Cannula  Additional Equipment: None  Intra-op Plan:   Post-operative Plan:   Informed Consent: I have reviewed the patients History and Physical, chart, labs and discussed the procedure including the risks, benefits and alternatives for the proposed anesthesia with the patient or authorized representative who has indicated his/her understanding and acceptance.     Dental advisory given  Plan Discussed with:  CRNA  Anesthesia Plan Comments:        Anesthesia Quick Evaluation

## 2022-10-06 ENCOUNTER — Encounter (HOSPITAL_COMMUNITY)
Admission: RE | Admit: 2022-10-06 | Discharge: 2022-10-06 | Disposition: A | Discharge status: Home / Self Care | Payer: BC Managed Care – PPO | Source: Ambulatory Visit | Attending: Family Medicine | Admitting: Family Medicine

## 2022-10-06 DIAGNOSIS — O34211 Maternal care for low transverse scar from previous cesarean delivery: Secondary | ICD-10-CM | POA: Diagnosis not present

## 2022-10-06 DIAGNOSIS — O34219 Maternal care for unspecified type scar from previous cesarean delivery: Secondary | ICD-10-CM | POA: Insufficient documentation

## 2022-10-06 DIAGNOSIS — Z3A00 Weeks of gestation of pregnancy not specified: Secondary | ICD-10-CM | POA: Insufficient documentation

## 2022-10-06 DIAGNOSIS — O9952 Diseases of the respiratory system complicating childbirth: Secondary | ICD-10-CM | POA: Diagnosis not present

## 2022-10-06 DIAGNOSIS — J45909 Unspecified asthma, uncomplicated: Secondary | ICD-10-CM | POA: Diagnosis not present

## 2022-10-06 DIAGNOSIS — O9972 Diseases of the skin and subcutaneous tissue complicating childbirth: Secondary | ICD-10-CM | POA: Diagnosis not present

## 2022-10-06 DIAGNOSIS — Z01812 Encounter for preprocedural laboratory examination: Secondary | ICD-10-CM | POA: Insufficient documentation

## 2022-10-06 DIAGNOSIS — Z3A39 39 weeks gestation of pregnancy: Secondary | ICD-10-CM | POA: Diagnosis not present

## 2022-10-06 DIAGNOSIS — L910 Hypertrophic scar: Secondary | ICD-10-CM | POA: Diagnosis not present

## 2022-10-06 LAB — TYPE AND SCREEN
ABO/RH(D): B POS
Antibody Screen: NEGATIVE

## 2022-10-06 LAB — CBC
HCT: 35.2 % — ABNORMAL LOW (ref 36.0–46.0)
Hemoglobin: 11.9 g/dL — ABNORMAL LOW (ref 12.0–15.0)
MCH: 30.6 pg (ref 26.0–34.0)
MCHC: 33.8 g/dL (ref 30.0–36.0)
MCV: 90.5 fL (ref 80.0–100.0)
Platelets: 179 10*3/uL (ref 150–400)
RBC: 3.89 MIL/uL (ref 3.87–5.11)
RDW: 13.7 % (ref 11.5–15.5)
WBC: 9.6 10*3/uL (ref 4.0–10.5)
nRBC: 0 % (ref 0.0–0.2)

## 2022-10-06 LAB — RPR: RPR Ser Ql: NONREACTIVE

## 2022-10-08 ENCOUNTER — Inpatient Hospital Stay (HOSPITAL_COMMUNITY): Payer: BC Managed Care – PPO | Admitting: Anesthesiology

## 2022-10-08 ENCOUNTER — Encounter (HOSPITAL_COMMUNITY): Payer: Self-pay | Admitting: Obstetrics

## 2022-10-08 ENCOUNTER — Encounter (HOSPITAL_COMMUNITY)
Admission: RE | Disposition: A | Discharge status: Home / Self Care | Payer: Self-pay | Source: Ambulatory Visit | Attending: Obstetrics

## 2022-10-08 ENCOUNTER — Other Ambulatory Visit: Payer: Self-pay

## 2022-10-08 ENCOUNTER — Inpatient Hospital Stay (HOSPITAL_COMMUNITY)
Admission: RE | Admit: 2022-10-08 | Discharge: 2022-10-10 | DRG: 788 | Disposition: A | Discharge status: Home / Self Care | Payer: BC Managed Care – PPO | Source: Ambulatory Visit | Attending: Obstetrics | Admitting: Obstetrics

## 2022-10-08 DIAGNOSIS — L910 Hypertrophic scar: Secondary | ICD-10-CM | POA: Diagnosis present

## 2022-10-08 DIAGNOSIS — Z3A39 39 weeks gestation of pregnancy: Secondary | ICD-10-CM | POA: Diagnosis not present

## 2022-10-08 DIAGNOSIS — O34211 Maternal care for low transverse scar from previous cesarean delivery: Secondary | ICD-10-CM | POA: Diagnosis present

## 2022-10-08 DIAGNOSIS — O9972 Diseases of the skin and subcutaneous tissue complicating childbirth: Secondary | ICD-10-CM | POA: Diagnosis present

## 2022-10-08 DIAGNOSIS — O34219 Maternal care for unspecified type scar from previous cesarean delivery: Principal | ICD-10-CM | POA: Diagnosis present

## 2022-10-08 DIAGNOSIS — Z98891 History of uterine scar from previous surgery: Secondary | ICD-10-CM

## 2022-10-08 SURGERY — Surgical Case
Anesthesia: Spinal

## 2022-10-08 MED ORDER — HYDROMORPHONE HCL 1 MG/ML IJ SOLN: 0.2500 mg | INTRAMUSCULAR | Status: DC | PRN

## 2022-10-08 MED ORDER — PRENATAL MULTIVITAMIN CH
1.0000 | ORAL_TABLET | Freq: Every day | ORAL | Status: DC
  Administered 2022-10-08 – 2022-10-09 (×2): 1 via ORAL
  Filled 2022-10-08 (×2): qty 1

## 2022-10-08 MED ORDER — CEFAZOLIN SODIUM-DEXTROSE 2-4 GM/100ML-% IV SOLN
INTRAVENOUS | Status: AC
  Filled 2022-10-08: qty 100

## 2022-10-08 MED ORDER — MEPERIDINE HCL 25 MG/ML IJ SOLN: 6.2500 mg | INTRAMUSCULAR | Status: DC | PRN

## 2022-10-08 MED ORDER — OXYCODONE HCL 5 MG/5ML PO SOLN: 5.0000 mg | Freq: Once | ORAL | Status: DC | PRN

## 2022-10-08 MED ORDER — FENTANYL CITRATE (PF) 100 MCG/2ML IJ SOLN
INTRAMUSCULAR | Status: DC | PRN
  Administered 2022-10-08: 15 ug via INTRATHECAL

## 2022-10-08 MED ORDER — OXYTOCIN-SODIUM CHLORIDE 30-0.9 UT/500ML-% IV SOLN
INTRAVENOUS | Status: AC
  Filled 2022-10-08: qty 500

## 2022-10-08 MED ORDER — LACTATED RINGERS IV SOLN: INTRAVENOUS | Status: DC

## 2022-10-08 MED ORDER — SIMETHICONE 80 MG PO CHEW
80.0000 mg | CHEWABLE_TABLET | Freq: Three times a day (TID) | ORAL | Status: DC
  Administered 2022-10-08 – 2022-10-10 (×6): 80 mg via ORAL
  Filled 2022-10-08 (×6): qty 1

## 2022-10-08 MED ORDER — ACETAMINOPHEN 10 MG/ML IV SOLN
INTRAVENOUS | Status: AC
  Filled 2022-10-08: qty 100

## 2022-10-08 MED ORDER — MORPHINE SULFATE (PF) 0.5 MG/ML IJ SOLN
INTRAMUSCULAR | Status: DC | PRN
  Administered 2022-10-08: .15 mg via INTRATHECAL

## 2022-10-08 MED ORDER — ONDANSETRON HCL 4 MG/2ML IJ SOLN
INTRAMUSCULAR | Status: AC
  Filled 2022-10-08: qty 2

## 2022-10-08 MED ORDER — DIPHENHYDRAMINE HCL 25 MG PO CAPS: 25.0000 mg | ORAL_CAPSULE | ORAL | Status: DC | PRN

## 2022-10-08 MED ORDER — NALOXONE HCL 4 MG/10ML IJ SOLN: 1.0000 ug/kg/h | INTRAVENOUS | Status: DC | PRN

## 2022-10-08 MED ORDER — ACETAMINOPHEN 500 MG PO TABS: 1000.0000 mg | ORAL_TABLET | Freq: Four times a day (QID) | ORAL | Status: DC

## 2022-10-08 MED ORDER — DIPHENHYDRAMINE HCL 50 MG/ML IJ SOLN: 12.5000 mg | INTRAMUSCULAR | Status: DC | PRN

## 2022-10-08 MED ORDER — STERILE WATER FOR IRRIGATION IR SOLN
Status: DC | PRN
  Administered 2022-10-08: 1

## 2022-10-08 MED ORDER — KETOROLAC TROMETHAMINE 30 MG/ML IJ SOLN: 30.0000 mg | Freq: Once | INTRAMUSCULAR | Status: DC | PRN

## 2022-10-08 MED ORDER — LACTATED RINGERS IV SOLN: INTRAVENOUS | Status: DC | PRN

## 2022-10-08 MED ORDER — ACETAMINOPHEN 500 MG PO TABS
1000.0000 mg | ORAL_TABLET | Freq: Four times a day (QID) | ORAL | Status: DC
  Administered 2022-10-08 – 2022-10-10 (×7): 1000 mg via ORAL
  Filled 2022-10-08 (×7): qty 2

## 2022-10-08 MED ORDER — SIMETHICONE 80 MG PO CHEW: 80.0000 mg | CHEWABLE_TABLET | ORAL | Status: DC | PRN

## 2022-10-08 MED ORDER — POVIDONE-IODINE 10 % EX SWAB: 2.0000 | Freq: Once | CUTANEOUS | Status: DC

## 2022-10-08 MED ORDER — ONDANSETRON HCL 4 MG/2ML IJ SOLN
INTRAMUSCULAR | Status: DC | PRN
  Administered 2022-10-08: 4 mg via INTRAVENOUS

## 2022-10-08 MED ORDER — NALOXONE HCL 0.4 MG/ML IJ SOLN: 0.4000 mg | INTRAMUSCULAR | Status: DC | PRN

## 2022-10-08 MED ORDER — BUPIVACAINE IN DEXTROSE 0.75-8.25 % IT SOLN
INTRATHECAL | Status: DC | PRN
  Administered 2022-10-08: 1.6 mL via INTRATHECAL

## 2022-10-08 MED ORDER — COCONUT OIL OIL: 1.0000 | TOPICAL_OIL | Status: DC | PRN

## 2022-10-08 MED ORDER — KETOROLAC TROMETHAMINE 30 MG/ML IJ SOLN
30.0000 mg | Freq: Four times a day (QID) | INTRAMUSCULAR | Status: AC
  Administered 2022-10-08 – 2022-10-09 (×4): 30 mg via INTRAVENOUS
  Filled 2022-10-08 (×4): qty 1

## 2022-10-08 MED ORDER — MORPHINE SULFATE (PF) 0.5 MG/ML IJ SOLN
INTRAMUSCULAR | Status: AC
  Filled 2022-10-08: qty 10

## 2022-10-08 MED ORDER — KETOROLAC TROMETHAMINE 30 MG/ML IJ SOLN: 30.0000 mg | Freq: Four times a day (QID) | INTRAMUSCULAR | Status: AC | PRN

## 2022-10-08 MED ORDER — PHENYLEPHRINE HCL-NACL 20-0.9 MG/250ML-% IV SOLN
INTRAVENOUS | Status: DC | PRN
  Administered 2022-10-08: 60 ug/min via INTRAVENOUS

## 2022-10-08 MED ORDER — ONDANSETRON HCL 4 MG/2ML IJ SOLN: 4.0000 mg | Freq: Once | INTRAMUSCULAR | Status: DC | PRN

## 2022-10-08 MED ORDER — AMISULPRIDE (ANTIEMETIC) 5 MG/2ML IV SOLN: 10.0000 mg | Freq: Once | INTRAVENOUS | Status: DC | PRN

## 2022-10-08 MED ORDER — OXYCODONE HCL 5 MG PO TABS: 5.0000 mg | ORAL_TABLET | Freq: Once | ORAL | Status: DC | PRN

## 2022-10-08 MED ORDER — DEXAMETHASONE SODIUM PHOSPHATE 10 MG/ML IJ SOLN
INTRAMUSCULAR | Status: DC | PRN
  Administered 2022-10-08 (×2): 5 mg via INTRAVENOUS

## 2022-10-08 MED ORDER — DIBUCAINE (PERIANAL) 1 % EX OINT: 1.0000 | TOPICAL_OINTMENT | CUTANEOUS | Status: DC | PRN

## 2022-10-08 MED ORDER — ONDANSETRON HCL 4 MG/2ML IJ SOLN: 4.0000 mg | Freq: Three times a day (TID) | INTRAMUSCULAR | Status: DC | PRN

## 2022-10-08 MED ORDER — SENNOSIDES-DOCUSATE SODIUM 8.6-50 MG PO TABS
2.0000 | ORAL_TABLET | ORAL | Status: DC
  Administered 2022-10-08 – 2022-10-09 (×2): 2 via ORAL
  Filled 2022-10-08 (×2): qty 2

## 2022-10-08 MED ORDER — OXYTOCIN-SODIUM CHLORIDE 30-0.9 UT/500ML-% IV SOLN
2.5000 [IU]/h | INTRAVENOUS | Status: AC
  Administered 2022-10-08: 2.5 [IU]/h via INTRAVENOUS
  Filled 2022-10-08: qty 500

## 2022-10-08 MED ORDER — ACETAMINOPHEN 10 MG/ML IV SOLN
INTRAVENOUS | Status: DC | PRN
  Administered 2022-10-08: 1000 mg via INTRAVENOUS

## 2022-10-08 MED ORDER — DEXAMETHASONE SODIUM PHOSPHATE 10 MG/ML IJ SOLN
INTRAMUSCULAR | Status: AC
  Filled 2022-10-08: qty 1

## 2022-10-08 MED ORDER — DIPHENHYDRAMINE HCL 25 MG PO CAPS: 25.0000 mg | ORAL_CAPSULE | Freq: Four times a day (QID) | ORAL | Status: DC | PRN

## 2022-10-08 MED ORDER — IBUPROFEN 600 MG PO TABS
600.0000 mg | ORAL_TABLET | Freq: Four times a day (QID) | ORAL | Status: DC
  Administered 2022-10-09 – 2022-10-10 (×3): 600 mg via ORAL
  Filled 2022-10-08 (×3): qty 1

## 2022-10-08 MED ORDER — MENTHOL 3 MG MT LOZG: 1.0000 | LOZENGE | OROMUCOSAL | Status: DC | PRN

## 2022-10-08 MED ORDER — WITCH HAZEL-GLYCERIN EX PADS: 1.0000 | MEDICATED_PAD | CUTANEOUS | Status: DC | PRN

## 2022-10-08 MED ORDER — FENTANYL CITRATE (PF) 100 MCG/2ML IJ SOLN
INTRAMUSCULAR | Status: AC
  Filled 2022-10-08: qty 2

## 2022-10-08 MED ORDER — SCOPOLAMINE 1 MG/3DAYS TD PT72
1.0000 | MEDICATED_PATCH | Freq: Once | TRANSDERMAL | Status: DC
  Administered 2022-10-08: 1.5 mg via TRANSDERMAL

## 2022-10-08 MED ORDER — SODIUM CHLORIDE 0.9% FLUSH: 3.0000 mL | INTRAVENOUS | Status: DC | PRN

## 2022-10-08 MED ORDER — CEFAZOLIN SODIUM-DEXTROSE 2-4 GM/100ML-% IV SOLN
2.0000 g | INTRAVENOUS | Status: AC
  Administered 2022-10-08: 2 g via INTRAVENOUS

## 2022-10-08 MED ORDER — OXYTOCIN-SODIUM CHLORIDE 30-0.9 UT/500ML-% IV SOLN
INTRAVENOUS | Status: DC | PRN
  Administered 2022-10-08: 300 mL via INTRAVENOUS

## 2022-10-08 MED ORDER — KETOROLAC TROMETHAMINE 30 MG/ML IJ SOLN
30.0000 mg | Freq: Four times a day (QID) | INTRAMUSCULAR | Status: AC | PRN
  Administered 2022-10-08 (×2): 30 mg via INTRAVENOUS

## 2022-10-08 MED ORDER — SCOPOLAMINE 1 MG/3DAYS TD PT72
MEDICATED_PATCH | TRANSDERMAL | Status: AC
  Filled 2022-10-08: qty 1

## 2022-10-08 MED ORDER — OXYCODONE HCL 5 MG PO TABS: 5.0000 mg | ORAL_TABLET | ORAL | Status: DC | PRN

## 2022-10-08 MED ORDER — KETOROLAC TROMETHAMINE 30 MG/ML IJ SOLN
INTRAMUSCULAR | Status: AC
  Filled 2022-10-08: qty 1

## 2022-10-08 SURGICAL SUPPLY — 38 items
APL PRP STRL LF DISP 70% ISPRP (MISCELLANEOUS) ×2
APL SKNCLS STERI-STRIP NONHPOA (GAUZE/BANDAGES/DRESSINGS) ×1
BENZOIN TINCTURE PRP APPL 2/3 (GAUZE/BANDAGES/DRESSINGS) ×1 IMPLANT
CHLORAPREP W/TINT 26 (MISCELLANEOUS) ×2 IMPLANT
CLAMP UMBILICAL CORD (MISCELLANEOUS) ×1 IMPLANT
CLOTH BEACON ORANGE TIMEOUT ST (SAFETY) ×1 IMPLANT
DRSG OPSITE POSTOP 4X10 (GAUZE/BANDAGES/DRESSINGS) ×1 IMPLANT
ELECT REM PT RETURN 9FT ADLT (ELECTROSURGICAL) ×1
ELECTRODE REM PT RTRN 9FT ADLT (ELECTROSURGICAL) ×1 IMPLANT
EXTRACTOR VACUUM KIWI (MISCELLANEOUS) IMPLANT
GLOVE BIOGEL M STER SZ 6 (GLOVE) ×1 IMPLANT
GLOVE BIOGEL PI IND STRL 6 (GLOVE) ×1 IMPLANT
GLOVE BIOGEL PI IND STRL 7.0 (GLOVE) ×1 IMPLANT
GOWN STRL REUS W/TWL LRG LVL3 (GOWN DISPOSABLE) ×2 IMPLANT
KIT ABG SYR 3ML LUER SLIP (SYRINGE) IMPLANT
NDL HYPO 25X5/8 SAFETYGLIDE (NEEDLE) IMPLANT
NEEDLE HYPO 25X5/8 SAFETYGLIDE (NEEDLE) IMPLANT
NS IRRIG 1000ML POUR BTL (IV SOLUTION) ×1 IMPLANT
PACK C SECTION WH (CUSTOM PROCEDURE TRAY) ×1 IMPLANT
PAD OB MATERNITY 4.3X12.25 (PERSONAL CARE ITEMS) ×1 IMPLANT
RTRCTR C-SECT PINK 25CM LRG (MISCELLANEOUS) ×1 IMPLANT
STRIP CLOSURE SKIN 1/2X4 (GAUZE/BANDAGES/DRESSINGS) ×1 IMPLANT
SUT MNCRL 0 VIOLET CTX 36 (SUTURE) ×2 IMPLANT
SUT MONOCRYL 0 CTX 36 (SUTURE) ×2
SUT PLAIN 0 NONE (SUTURE) IMPLANT
SUT PLAIN 2 0 (SUTURE) ×1
SUT PLAIN ABS 2-0 CT1 27XMFL (SUTURE) ×1 IMPLANT
SUT PROLENE 1 CT (SUTURE) IMPLANT
SUT VIC AB 0 CTX 36 (SUTURE) ×2
SUT VIC AB 0 CTX36XBRD ANBCTRL (SUTURE) ×2 IMPLANT
SUT VIC AB 2-0 CT1 27 (SUTURE) ×1
SUT VIC AB 2-0 CT1 TAPERPNT 27 (SUTURE) ×1 IMPLANT
SUT VIC AB 3-0 SH 27 (SUTURE) ×1
SUT VIC AB 3-0 SH 27X BRD (SUTURE) IMPLANT
SUT VICRYL 4-0 PS2 18IN ABS (SUTURE) ×1 IMPLANT
TOWEL OR 17X24 6PK STRL BLUE (TOWEL DISPOSABLE) ×1 IMPLANT
TRAY FOLEY W/BAG SLVR 14FR LF (SET/KITS/TRAYS/PACK) ×1 IMPLANT
WATER STERILE IRR 1000ML POUR (IV SOLUTION) ×1 IMPLANT

## 2022-10-08 NOTE — Transfer of Care (Signed)
Immediate Anesthesia Transfer of Care Note  Patient: Lori Blackwell  Procedure(s) Performed: CESAREAN SECTION  Patient Location: PACU  Anesthesia Type:Spinal  Level of Consciousness: awake  Airway & Oxygen Therapy: Patient Spontanous Breathing  Post-op Assessment: Report given to RN and Post -op Vital signs reviewed and stable  Post vital signs: Reviewed and stable  Last Vitals:  Vitals Value Taken Time  BP 108/53 10/08/22 0840  Temp    Pulse 90 10/08/22 0843  Resp 13 10/08/22 0843  SpO2 95 % 10/08/22 0843  Vitals shown include unvalidated device data.  Last Pain:  Vitals:   10/08/22 0543  TempSrc: Oral  PainSc: 0-No pain         Complications: No notable events documented.

## 2022-10-08 NOTE — Op Note (Signed)
Cesarean Section Procedure Note  Pre-operative Diagnosis: 1. Intrauterine pregnancy at [redacted]w[redacted]d 2. History of cesarean delivery, desires elective repeat  Post-operative Diagnosis: same as above  Surgeon: DJerelyn Charles MD  Assistants: MIrene Pap MD  Procedure: Repeat low transverse cesarean section   Anesthesia: Spinal anesthesia  Estimated Blood Loss: 125 mL         Drains: Foley catheter         Specimens: placenta to L&D              Complications:  None; patient tolerated the procedure well.         Disposition: PACU - hemodynamically stable.  Findings:  Normal uterus, tubes and ovaries bilaterally.  Viable female infant, 3280 g (7 lb 3.7 oz) Apgars 8, 9.    Procedure Details   After spinal  anesthesia was found to adequate, the patient was placed in the dorsal supine position with a leftward tilt, prepped and draped in the usual sterile manner. A Pfannenstiel incision was made.  There was a keloid in mid-left portion of the incision.  This was excised with a scalpel and mayo scissors.  The incision was then carried down through the subcutaneous tissue to the fascia.  The fascia was incised in the midline and the fascial incision was extended laterally with Mayo scissors. The superior aspect of the fascial incision was grasped with two Kocher clamps, tented up and the rectus muscles dissected off sharply. The rectus was then dissected off with blunt dissection and Mayo scissors inferiorly. The rectus muscles were separated in the midline. The abdominal peritoneum was identified, tented up, entered bluntly, and the incision was extended superiorly and inferiorly with good visualization of the bladder. The Alexis retractor was deployed. The vesicouterine peritoneum was identified and entered bluntly.  The bladder was mildly adhered to the lower uterine segment so a bladder flap was created.  A scalpel was then used to make a low transverse incision on the uterus which was extended in  the cephalad-caudad direction with blunt dissection. The fluid was clear. The fetal vertex was identified, elevated out of the pelvis and brought to the hysterotomy.  The head was delivered easily followed by the shoulders and body.  After a 60 second delay per protocol, the cord was clamped and cut and the infant was passed to the waiting neonatologist.  The placenta was then delivered spontaneously, intact and appear normal, the uterus was cleared of all clot and debris   The hysterotomy was repaired with #0 Monocryl in running locked fashion.  A second imbricating layer of #0 Monocryl was placed.   The Alexis retractor was removed from the abdomen. The peritoneum was examined and all vessels noted to be hemostatic. The abdominal cavity was cleared of all clot and debris.  The peritoneum was closed with 2-0 vicryl in a running fashion.  The rectus muscles were then closed with 2-0 Vicryl. The fascia and rectus muscles were inspected and were hemostatic. The fascia was closed with 0 Vicryl in a running fashion. The subcutaneous layer was irrigated and all bleeders cauterized. The subcutaneous layer was closed with interrupted plain gut.  Due to prior keloid, deep dermal layer was placed just delow the subcutaneous layer with 3-0 vicryl running suture to decrease the tension on the skin.  The skin was closed with 4-0 vicryl in a subcuticular fashion. The incision was dressed with benzoine, steri strips and honeycomb dressing. All sponge lap and needle counts were correct x3. Patient tolerated  the procedure well and recovered in stable condition following the procedure.

## 2022-10-08 NOTE — Lactation Note (Signed)
This note was copied from a baby's chart. Lactation Consultation Note  Patient Name: Lori Blackwell [id number] Date: 10/08/2022 Age:34 hours Reason for consult: Initial assessment;Term;Breastfeeding assistance  LC entered the room and the infant was being held by a support person.  Per the birth parent she has hand expressed and spoon fed the infant.  The birth parent is an experienced breastfeeding parent.  The birth parent had latching issues with her previous child and had to use a nipple shield.  The birth parent asked about flange sizes.  Lori Blackwell educated the birth parent on how to find the correct size flange.  LC also let the birth parent know that things can change with the size of the flange as her milk volume increases.  Lori Blackwell asked the birth parent to call when she is ready to latch the infant for a latch check.  Lori Blackwell left her name on the board and exited the room.   Infant Feeding Plan:  Breastfeed 8+ times in 24 hours according to feeding cues.  Hand express and feed the infant the expressed milk via a spoon.  Call Lori Blackwell for assistance with breastfeeding.    Maternal Data Has patient been taught Hand Expression?: Yes Does the patient have breastfeeding experience prior to this delivery?: Yes How long did the patient breastfeed?: 9 months  Feeding Mother's Current Feeding Choice: Breast Milk  LATCH Score                    Lactation Tools Discussed/Used    Interventions Interventions: Education;LC Services brochure  Discharge Pump: DEBP;Hands Free;Personal  Consult Status Consult Status: Follow-up Date: 10/09/22 Follow-up type: In-patient    Lori Blackwell 10/08/2022, 12:55 PM

## 2022-10-08 NOTE — Anesthesia Procedure Notes (Signed)
Spinal  Patient location during procedure: OR Start time: 10/08/2022 7:28 AM End time: 10/08/2022 7:31 AM Reason for block: surgical anesthesia Staffing Performed: anesthesiologist  Anesthesiologist: Pervis Hocking, DO Performed by: Pervis Hocking, DO Authorized by: Pervis Hocking, DO   Preanesthetic Checklist Completed: patient identified, IV checked, risks and benefits discussed, surgical consent, monitors and equipment checked, pre-op evaluation and timeout performed Spinal Block Patient position: sitting Prep: DuraPrep and site prepped and draped Patient monitoring: cardiac monitor, continuous pulse ox and blood pressure Approach: midline Location: L3-4 Injection technique: single-shot Needle Needle type: Pencan  Needle gauge: 24 G Needle length: 9 cm Assessment Sensory level: T6 Events: CSF return Additional Notes Functioning IV was confirmed and monitors were applied. Sterile prep and drape, including hand hygiene and sterile gloves were used. The patient was positioned and the spine was prepped. The skin was anesthetized with lidocaine.  Free flow of clear CSF was obtained prior to injecting local anesthetic into the CSF.  The spinal needle aspirated freely following injection.  The needle was carefully withdrawn.  The patient tolerated the procedure well.   Performed by srna, clear CSF no issues

## 2022-10-08 NOTE — H&P (Signed)
32 y.o. G2P1001 @ 66w3dpresents for repeat cesarean section.  Otherwise has good fetal movement and no bleeding.   Her pregnancy has been complicated only by a history of prior cesarean section for breech and short interval pregnancy.  She desires elective repeat cesarean delivery  Past Medical History:  Diagnosis Date   Allergy    Asthma    SEASONAL    Bronchitis    Chicken pox    Chlamydia    from prenatal   Complication of anesthesia    HARD TIME WAKING UP   Dysrhythmia    H/O IN COLLEGE-HAD EKG WHICH PT STATES WAS NORMAL-NO PROBLEMS SINCE   Heart murmur    BORN WITH HEART MURMUR-OUTGREW-ASYMPTOMATIC   HPV (human papilloma virus) infection    from prenatal   Kidney stone    PONV (postoperative nausea and vomiting)     Past Surgical History:  Procedure Laterality Date   APPENDECTOMY  2001   BREAST BIOPSY  2020   fibroabnormal   CESAREAN SECTION N/A 04/15/2021   Procedure: CESAREAN SECTION;  Surgeon: CJerelyn Charles MD;  Location: [facility];  Service: Obstetrics;  Laterality: N/A;   LEEP     NASAL SINUS SURGERY N/A 11/13/2015   Procedure: ENDOSCOPIC SINUS SURGERY;  Surgeon: CCarloyn Manner MD;  Location: [facility];  Service: ENT;  Laterality: N/A;   SEPTOPLASTY WITH ETHMOIDECTOMY, AND MAXILLARY ANTROSTOMY Bilateral 11/13/2015   Procedure: SEPTOPLASTY WITH ETHMOIDECTOMY, AND MAXILLARY ANTROSTOMY;  Surgeon: CCarloyn Manner MD;  Location: [facility];  Service: ENT;  Laterality: Bilateral;  bilateral maxillary antrostomy, left anterior ethmoidectomy, left frontal sinusotomy   SHOULDER SURGERY Right 2008   2011   TURBINATE REDUCTION Bilateral 11/13/2015   Procedure: TURBINATE REDUCTION;  Surgeon: CCarloyn Manner MD;  Location: [facility];  Service: ENT;  Laterality: Bilateral;   WISDOM TOOTH EXTRACTION      OB History  Gravida Para Term Preterm AB Living  '2 1 1     1  '$ SAB IAB Ectopic Multiple Live Births        0 1    # Outcome Date GA Lbr Len/2nd Weight Sex Delivery Anes PTL  Lv  2 Current           1 Term 04/15/21 [id number] g F CS-LTranv EPI  LIV    Social History   Socioeconomic History   Marital status: Married    Spouse name: JuLarkin Ina Number of children: 0   Years of education: 16   Highest education level: Not on file  Occupational History   Occupation: LeHorticulturist, commercialTobacco Use   Smoking status: Never   Smokeless tobacco: Never  Vaping Use   Vaping Use: Never used  Substance and Sexual Activity   Alcohol use: Not Currently    Comment: 1-3 drinks weekly   Drug use: No   Sexual activity: Not Currently  Other Topics Concern   Not on file  Social History Narrative   [place] [employer] and obtained her BaPaediatric nursen [employer]She is working as a KiOncologistt [employer]She [place] in [place]     Hobbies: Running, paint with acrylics   Exercise: Regularly attends [employer] and does weights, interval training, cardio   Diet: Follows clean eating and allows herself 3 cheat meals weekly.   Social Determinants of Health   Financial Resource Strain: Not on file  Food Insecurity: Not on file  Transportation Needs:  Not on file  Physical Activity: Not on file  Stress: Not on file  Social Connections: Not on file  Intimate Partner Violence: Not on file   Shellfish allergy and Sulfa antibiotics    Prenatal Transfer Tool  Maternal Diabetes: No Genetic Screening: Normal Maternal Ultrasounds/Referrals: Normal Fetal Ultrasounds or other Referrals:  None Maternal Substance Abuse:  No Significant Maternal Medications:  None Significant Maternal Lab Results: Group B Strep negative  ABO, Rh: --/--/B POS (02/28 0919) Antibody: NEG (02/28 0919) Rubella:  Immune RPR: NON REACTIVE (02/28 0913)  HBsAg: Negative (08/14 0000)  HIV: Non-reactive (08/14 0000)  GBS: Negative/-- (02/09 0000)     Vitals:   10/08/22 0538 10/08/22 0543  BP:    Pulse: 91   Temp:  97.6 F  (36.4 C)  SpO2: 98%      General:  NAD Abdomen:  soft, gravid Ex:  trace edema FHTs:  present    A/P   32 y.o. G2P1001 60w3dpresents for repeat cesarean section.    Discussed risks of cesarean section to include, but not limited to, infection, bleeding, damage to surrounding strutcures (including bowel, bladder, tubes, ovaries, nerves, vessels, baby), need for additional procedures, risk of blood clot, need for transfusion. Consent signed.  Does not desire permanent sterilization Ancef 2gm on call to [place]

## 2022-10-08 NOTE — Anesthesia Postprocedure Evaluation (Signed)
Anesthesia Post Note  Patient: Diore Kapolka  Procedure(s) Performed: CESAREAN SECTION     Patient location during evaluation: PACU Anesthesia Type: Spinal Level of consciousness: awake and alert and oriented Pain management: pain level controlled Vital Signs Assessment: post-procedure vital signs reviewed and stable Respiratory status: spontaneous breathing, nonlabored ventilation and respiratory function stable Cardiovascular status: blood pressure returned to baseline and stable Postop Assessment: no headache, no backache, spinal receding and patient able to bend at knees Anesthetic complications: no   No notable events documented.  Last Vitals:  Vitals:   10/08/22 0959 10/08/22 1015  BP:  117/67  Pulse: 79 74  Resp: 15 16  Temp:  36.4 C  SpO2: 99% 100%    Last Pain:  Vitals:   10/08/22 1015  TempSrc: Axillary  PainSc: 0-No pain   Pain Goal:    LLE Motor Response: Purposeful movement (10/08/22 0959) LLE Sensation: Increased (10/08/22 0959) RLE Motor Response: Purposeful movement (10/08/22 0959) RLE Sensation: Increased (10/08/22 0959)     Epidural/Spinal Function Cutaneous sensation: Able to Wiggle Toes (10/08/22 1015), Patient able to flex knees: Yes (10/08/22 1015), Patient able to lift hips off bed: No (10/08/22 1015), Back pain beyond tenderness at insertion site: No (10/08/22 1015), Progressively worsening motor and/or sensory loss: No (10/08/22 1015), Bowel and/or bladder incontinence post epidural: No (10/08/22 1015)  Jarome Matin [place]

## 2022-10-09 ENCOUNTER — Encounter (HOSPITAL_COMMUNITY): Payer: Self-pay | Admitting: Obstetrics

## 2022-10-09 LAB — CBC
HCT: 28.9 % — ABNORMAL LOW (ref 36.0–46.0)
Hemoglobin: 9.3 g/dL — ABNORMAL LOW (ref 12.0–15.0)
MCH: 29.6 pg (ref 26.0–34.0)
MCHC: 32.2 g/dL (ref 30.0–36.0)
MCV: 92 fL (ref 80.0–100.0)
Platelets: 149 10*3/uL — ABNORMAL LOW (ref 150–400)
RBC: 3.14 MIL/uL — ABNORMAL LOW (ref 3.87–5.11)
RDW: 13.9 % (ref 11.5–15.5)
WBC: 14 10*3/uL — ABNORMAL HIGH (ref 4.0–10.5)
nRBC: 0 % (ref 0.0–0.2)

## 2022-10-09 LAB — BIRTH TISSUE RECOVERY COLLECTION (PLACENTA DONATION)

## 2022-10-09 MED ORDER — FAMOTIDINE 20 MG PO TABS
20.0000 mg | ORAL_TABLET | Freq: Once | ORAL | Status: AC
  Administered 2022-10-09: 20 mg via ORAL
  Filled 2022-10-09: qty 1

## 2022-10-09 MED ORDER — FAMOTIDINE 20 MG PO TABS
20.0000 mg | ORAL_TABLET | Freq: Two times a day (BID) | ORAL | Status: DC | PRN
  Administered 2022-10-10: 20 mg via ORAL
  Filled 2022-10-09: qty 1

## 2022-10-09 NOTE — Progress Notes (Signed)
Patient is doing well.  She is tolerating PO, ambulating, voiding.  Pain is controlled.  Lochia is appropriate  Vitals:   10/08/22 1322 10/08/22 2057 10/09/22 0108 10/09/22 0545  BP: 117/60 (!) 102/49 (!) 112/58 (!) 109/53  Pulse: 83 (!) 56 78 80  Resp: '16 16 16 16  '$ Temp: 97.9 F (36.6 C) 98.1 F (36.7 C) 98.4 F (36.9 C) 98.6 F (37 C)  TempSrc: Oral Oral Oral Oral  SpO2: 98% 98% 98% 98%  Weight:      Height:        NAD Abdomen:  soft, appropriate tenderness, incisions intact and without erythema or drainage ext:    Symmetric, trace edema bilaterally  Lab Results  Component Value Date   WBC 14.0 (H) 10/09/2022   HGB 9.3 (L) 10/09/2022   HCT 28.9 (L) 10/09/2022   MCV 92.0 10/09/2022   PLT 149 (L) 10/09/2022    --/--/B POS (02/28 0919)/RImmune  A/P    32 y.o. G2P2002 POD #1 s/p RCS Routine post op and postpartum care.   Anticipate discharge tomorrow  Desires circumcision.   Discussed r/b/a of the procedure.  Reviewed that circumcision is an elective surgical procedure and not considered medically necessary.  Reviewed the risks of the procedure including the risk of infection, bleeding, damage to surrounding structures, including scrotum, shaft, urethra and head of penis, and an undesired cosmetic effect requiring additional procedures for revision.  Consent signed.

## 2022-10-09 NOTE — Plan of Care (Signed)

## 2022-10-09 NOTE — Lactation Note (Signed)
This note was copied from a baby's chart. Lactation Consultation Note  Patient Name: Lori Blackwell [id number] Date: 10/09/2022 Age:34 hours  Reason for consult: Follow-up assessment  P2, 39.3 GA, 1% weight loss  Mother reports baby is breastfeeding well. She denies any concerns or questions. She reports that she used a nipple shield with her last baby and is not needing that so far with this baby.   Mother states she is having slight nipple soreness. Denies need for coconut oil. She wants to try her "silverette nipple covers" being brought in today from home. Will call LC/ RN if she needs any assistance. LC to follow PRN.    Maternal Data Has patient been taught Hand Expression?: Yes Does the patient have breastfeeding experience prior to this delivery?: Yes  Feeding Mother's Current Feeding Choice: Breast Milk    Interventions Interventions: Education  Discharge Pump: DEBP;Personal  Consult Status Consult Status: PRN Date: 10/09/22 Follow-up type: In-patient    Stana Bunting M 10/09/2022, 4:22 PM

## 2022-10-10 MED ORDER — OXYCODONE HCL 5 MG PO TABS: 5.0000 mg | ORAL_TABLET | ORAL | 0 refills | Status: DC | PRN

## 2022-10-10 MED ORDER — IBUPROFEN 600 MG PO TABS: 600.0000 mg | ORAL_TABLET | Freq: Four times a day (QID) | ORAL | 0 refills | Status: DC | PRN

## 2022-10-10 NOTE — Discharge Summary (Signed)
Postpartum Discharge Summary       Patient Name: Lori Blackwell DOB: 08-21-1990 MRN: [id number]  Date of admission: 10/08/2022 Delivery date:10/08/2022  Delivering provider: Jerelyn Charles  Date of discharge: 10/10/2022  Admitting diagnosis: History of cesarean delivery, antepartum [O34.219] History of cesarean delivery [Z98.891] Intrauterine pregnancy: [redacted]w[redacted]d    Secondary diagnosis:  Principal Problem:   History of cesarean delivery, antepartum Active Problems:   History of cesarean delivery    Discharge diagnosis: Term Pregnancy Delivered                                              Post partum procedures: NA Augmentation: N/A Complications: None  Hospital course: Sceduled C/S   32y.o. yo G2P2002 at 343w3das admitted to the hospital 10/08/2022 for scheduled cesarean section with the following indication:Elective Repeat.Delivery details are as follows:  Membrane Rupture Time/Date: 7:54 AM ,10/08/2022   Delivery Method:C-Section, Low Transverse  Details of operation can be found in separate operative note.  Patient had a postpartum course complicated by nothing.  She is ambulating, tolerating a regular diet, passing flatus, and urinating well. Patient is discharged home in stable condition on  10/10/22        Newborn Data: Birth date:10/08/2022  Birth time:7:54 AM  Gender:Female  Living status:Living  Apgars:8 ,9  Weight:3280 g     Magnesium Sulfate received: No BMZ received: No Rhophylac:N/A  Physical exam  Vitals:   10/09/22 0545 10/09/22 1458 10/09/22 2017 10/10/22 0500  BP: (!) 109/53 117/73 111/68 119/69  Pulse: 80 78 83 72  Resp: '16 17 18 18  '$ Temp: 98.6 F (37 C) 98 F (36.7 C) 98.2 F (36.8 C) 98.6 F (37 C)  TempSrc: Oral Oral Oral Oral  SpO2: 98% 99% 98% 99%  Weight:      Height:       General: alert, cooperative, and no distress Lochia: appropriate Uterine Fundus: firm Incision: Healing well with no significant drainage DVT Evaluation: No evidence of  DVT seen on physical exam. Labs: Lab Results  Component Value Date   WBC 14.0 (H) 10/09/2022   HGB 9.3 (L) 10/09/2022   HCT 28.9 (L) 10/09/2022   MCV 92.0 10/09/2022   PLT 149 (L) 10/09/2022      Latest Ref Rng & Units 10/01/2021   11:03 AM  CMP  Glucose 70 - 99 mg/dL 95   BUN 6 - 23 mg/dL 20   Creatinine 0.40 - 1.20 mg/dL 1.03   Sodium 135 - 145 mEq/L 138   Potassium 3.5 - 5.1 mEq/L 4.7   Chloride 96 - 112 mEq/L 101   CO2 19 - 32 mEq/L 30   Calcium 8.4 - 10.5 mg/dL 9.9   Total Protein 6.0 - 8.3 g/dL 8.0   Total Bilirubin 0.2 - 1.2 mg/dL 0.5   Alkaline Phos 39 - 117 U/L 62   AST 0 - 37 U/L 18   ALT 0 - 35 U/L 14    Edinburgh Score:    10/08/2022   11:23 AM  Edinburgh Postnatal Depression Scale Screening Tool  I have been able to laugh and see the funny side of things. 0  I have looked forward with enjoyment to things. 0  I have blamed myself unnecessarily when things went wrong. 0  I have been anxious or worried for no good reason. 0  I have felt scared or panicky for no good reason. 0  Things have been getting on top of me. 0  I have been so unhappy that I have had difficulty sleeping. 0  I have felt sad or miserable. 0  I have been so unhappy that I have been crying. 0  The thought of harming myself has occurred to me. 0  Edinburgh Postnatal Depression Scale Total 0      After visit meds:  Allergies as of 10/10/2022       Reactions   Shellfish Allergy Anaphylaxis   Sulfa Antibiotics Rash        Medication List     STOP taking these medications    albuterol 108 (90 Base) MCG/ACT inhaler Commonly known as: VENTOLIN HFA   calcium carbonate 500 MG chewable tablet Commonly known as: TUMS - dosed in mg elemental calcium   famotidine 20 MG tablet Commonly known as: PEPCID   PRENATAL VITAMIN PO   terconazole 0.4 % vaginal cream Commonly known as: TERAZOL 7       TAKE these medications    ibuprofen 600 MG tablet Commonly known as: ADVIL Take 1  tablet (600 mg total) by mouth every 6 (six) hours as needed.   oxyCODONE 5 MG immediate release tablet Commonly known as: Roxicodone Take 1 tablet (5 mg total) by mouth every 4 (four) hours as needed for severe pain.               Discharge Care Instructions  (From admission, onward)           Start     Ordered   10/10/22 0000  Discharge wound care:       Comments: For a cesarean delivery: You may wash incision with soap and water.  Do not soak or submerge the incision for 2 weeks. Keep incision dry. You may need to keep a sanitary pad or panty liner between the incision and your clothing for comfort and to keep the incision dry. If you note drainage, increased pain, or increased redness of the incision, then please notify your physician.   10/10/22 1039   10/10/22 0000  If the dressing is still on your incision site when you go home, remove it on the third day after your surgery date. Remove dressing if it begins to fall off, or if it is dirty or damaged before the third day.       Comments: For a cesarean delivery   10/10/22 1039             Discharge home in stable condition Infant Feeding: Breast Infant Disposition:home with mother Discharge instruction: per After Visit Summary and Postpartum booklet. Activity: Advance as tolerated. Pelvic rest for 6 weeks.  Diet: routine diet Anticipated Birth Control: Unsure Postpartum Appointment:4 weeks Future Appointments:No future appointments. Follow up Visit:  Follow-up Information     Jerelyn Charles, MD Follow up in 4 week(s).   Specialty: Obstetrics Why: For a postpartum evaluation Contact information: [address] [place] [address] [phone]                     10/10/2022 Vanessa Kick, MD

## 2022-10-10 NOTE — Plan of Care (Signed)

## 2022-10-11 ENCOUNTER — Telehealth: Payer: Self-pay

## 2022-10-11 NOTE — Transitions of Care (Post Inpatient/ED Visit) (Signed)
   10/11/2022  Name: Lori Blackwell MRN: [id number] DOB: March 31, 1991  Today's TOC FU Call Status: Today's TOC FU Call Status:: Successful TOC FU Call Competed TOC FU Call Complete Date: 10/11/22  Transition Care Management Follow-up Telephone Call Date of Discharge: 10/10/22 Discharge Facility: [facility] [place]) Type of Discharge: Inpatient Admission Primary Inpatient Discharge Diagnosis:: "c-section delivery" How have you been since you were released from the hospital?: Better (Patient states she just took baby to Pediatrician appt. Baby is doing well. Breastfeeding going okay. Pain controlled with alternating between Tylenol and Motrin. Vaginal bleeding has slowed down/decreased a lot per patient.) Any questions or concerns?: No  Items Reviewed: Did you receive and understand the discharge instructions provided?: Yes Medications obtained and verified?: Yes (Medications Reviewed) Any new allergies since your discharge?: No Dietary orders reviewed?: Yes Type of Diet Ordered:: regular as tolerated Do you have support at home?: Yes People in Home: spouse Name of Support/Comfort Primary Source: [facility] and Equipment/Supplies: [place] Ordered?: NA Any new equipment or medical supplies ordered?: NA  Functional Questionnaire: Do you need assistance with bathing/showering or dressing?: No Do you need assistance with meal preparation?: No Do you need assistance with eating?: No Do you have difficulty maintaining continence: No Do you need assistance with getting out of bed/getting out of a chair/moving?: No Do you have difficulty managing or taking your medications?: No  Folllow up appointments reviewed: PCP Follow-up appointment confirmed?: NA Specialist Hospital Follow-up appointment confirmed?: No Reason Specialist Follow-Up Not Confirmed: Patient has Specialist Provider Number and will Call for Appointment (Patient will call OB/GYN office today to make  follow up appt) Do you need transportation to your follow-up appointment?: No Do you understand care options if your condition(s) worsen?: Yes-patient verbalized understanding  SDOH Interventions Today    Flowsheet Row Most Recent Value  SDOH Interventions   Food Insecurity Interventions Intervention Not Indicated  Transportation Interventions Intervention Not Indicated       TOC Interventions Today    Flowsheet Row Most Recent Value  TOC Interventions   TOC Interventions Discussed/Reviewed TOC Interventions Discussed, Post discharge activity limitations per provider, Post op wound/incision care, S/S of infection  [post partem/c-section care, pain mgmt]       Interventions Today    Flowsheet Row Most Recent Value  Nutrition Interventions   Nutrition Discussed/Reviewed Nutrition Discussed  Pharmacy Interventions   Pharmacy Dicussed/Reviewed Pharmacy Topics Discussed, Medications and their functions       Hetty Blend [facility] Care Management Care Management Community Coordinator Direct Phone: [phone] Toll Free: [phone] Fax: [phone]

## 2022-10-19 ENCOUNTER — Telehealth (HOSPITAL_COMMUNITY): Payer: Self-pay | Admitting: *Deleted

## 2022-10-19 NOTE — Telephone Encounter (Signed)
Left phone voicemail message.  Odis Hollingshead, RN 10-19-2022 at 4:05pm

## 2022-11-09 DIAGNOSIS — Z1331 Encounter for screening for depression: Secondary | ICD-10-CM | POA: Diagnosis not present

## 2022-11-12 DIAGNOSIS — Z3202 Encounter for pregnancy test, result negative: Secondary | ICD-10-CM | POA: Diagnosis not present

## 2022-11-12 DIAGNOSIS — Z3042 Encounter for surveillance of injectable contraceptive: Secondary | ICD-10-CM | POA: Diagnosis not present

## 2022-12-02 DIAGNOSIS — C44519 Basal cell carcinoma of skin of other part of trunk: Secondary | ICD-10-CM | POA: Diagnosis not present

## 2022-12-02 DIAGNOSIS — L989 Disorder of the skin and subcutaneous tissue, unspecified: Secondary | ICD-10-CM | POA: Diagnosis not present

## 2022-12-14 DIAGNOSIS — L821 Other seborrheic keratosis: Secondary | ICD-10-CM | POA: Diagnosis not present

## 2022-12-14 DIAGNOSIS — L814 Other melanin hyperpigmentation: Secondary | ICD-10-CM | POA: Diagnosis not present

## 2022-12-14 DIAGNOSIS — Z08 Encounter for follow-up examination after completed treatment for malignant neoplasm: Secondary | ICD-10-CM | POA: Diagnosis not present

## 2022-12-14 DIAGNOSIS — D1801 Hemangioma of skin and subcutaneous tissue: Secondary | ICD-10-CM | POA: Diagnosis not present

## 2022-12-18 IMAGING — DX DG CHEST 2V
2 series · 2 of 2 positions shown · non-contrast
Comparison: 09/09/2007

CLINICAL DATA: Short of breath 5 months since childbirth

EXAM:
CHEST - 2 VIEW

[chest pa]
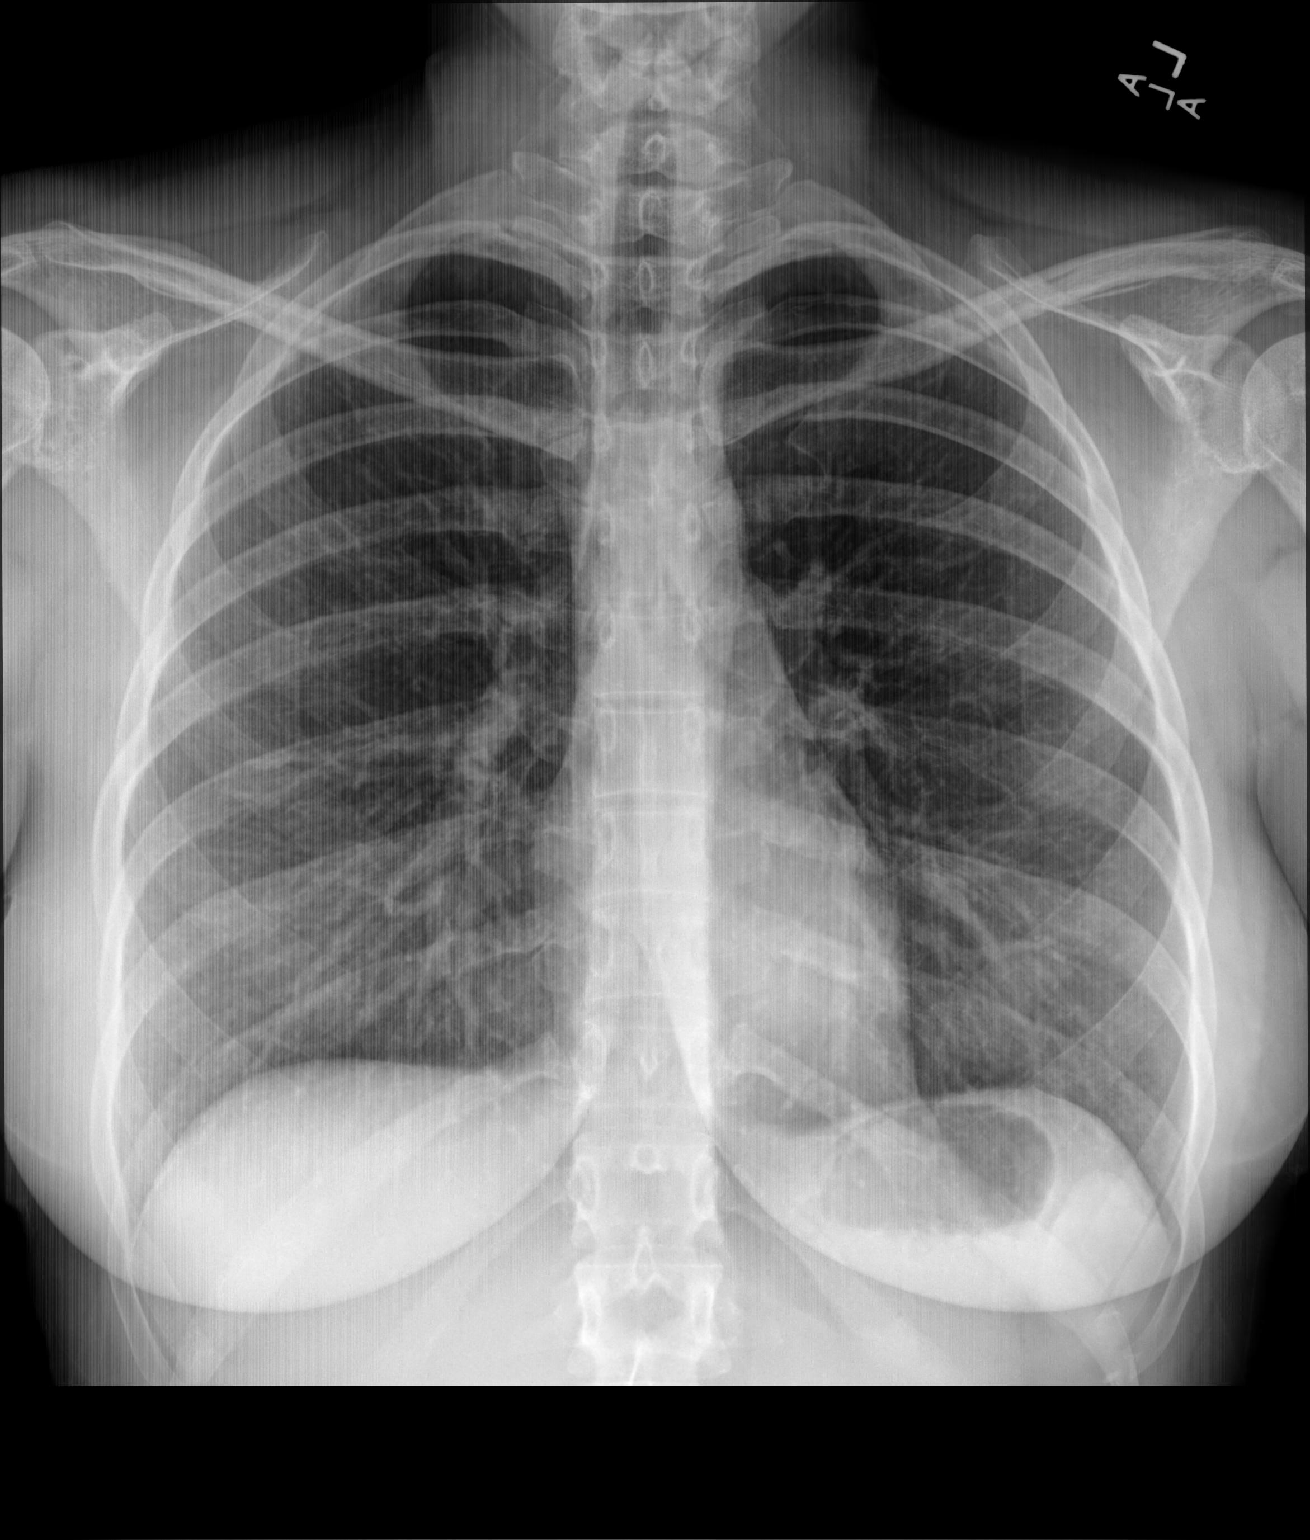

[chest lat]
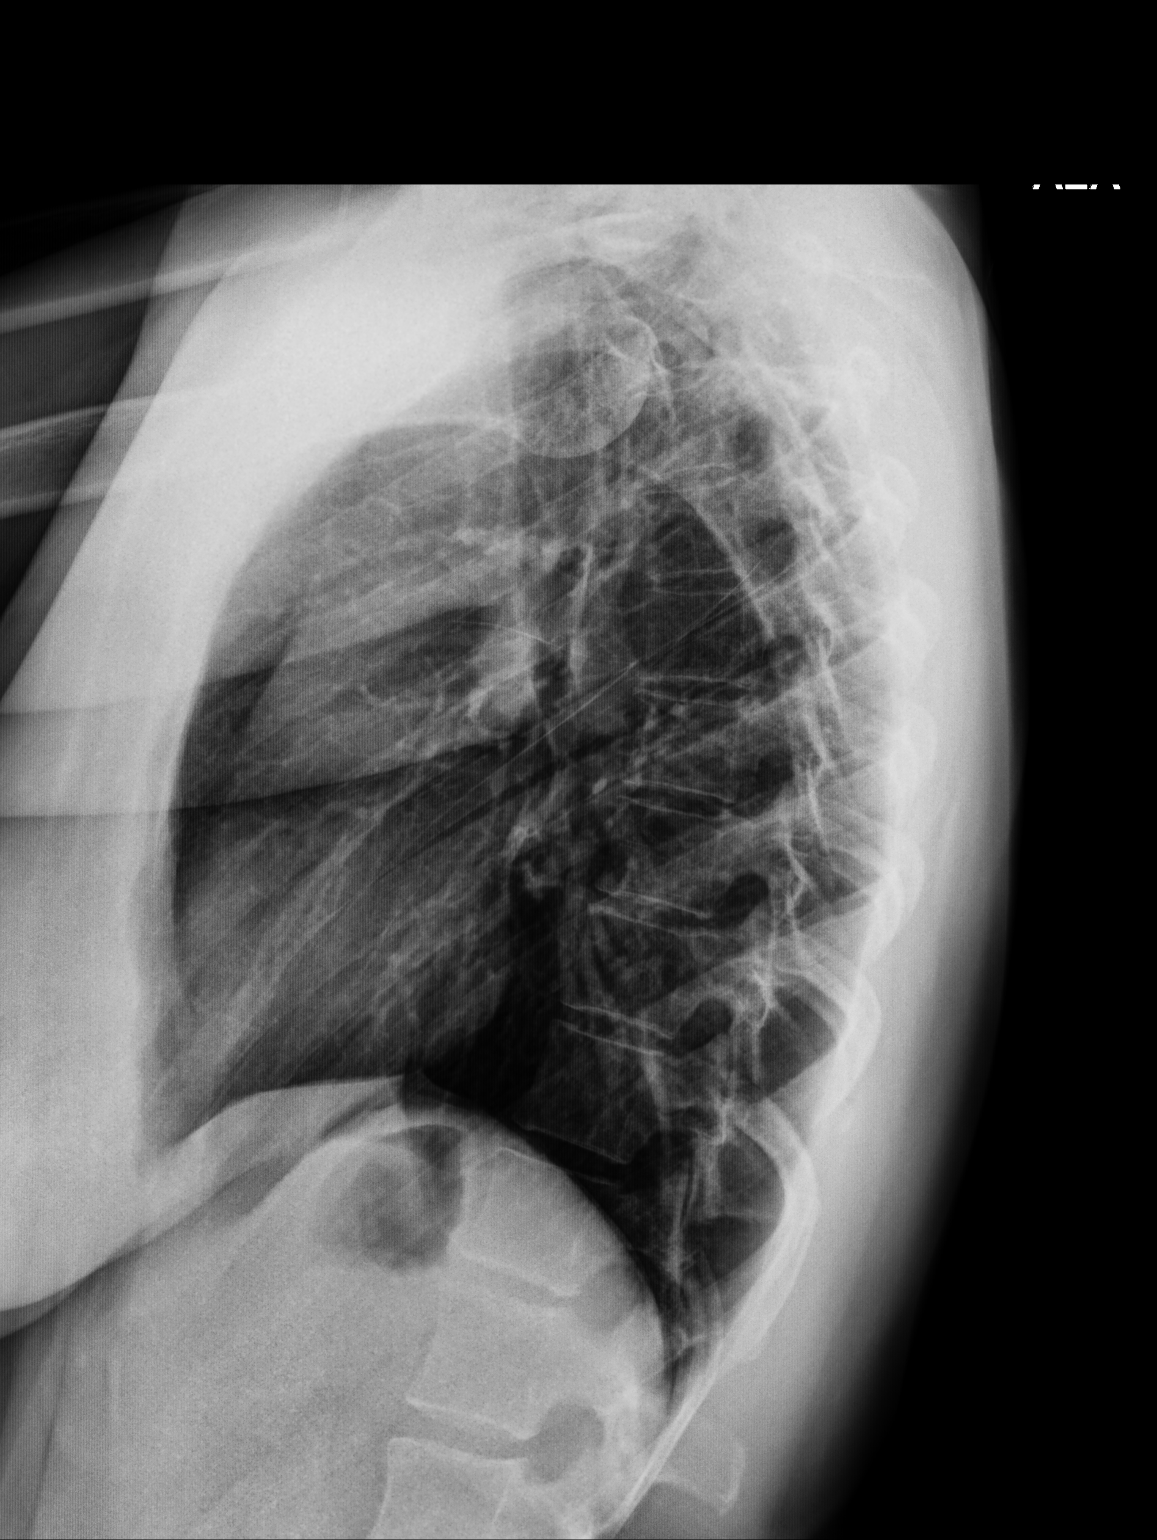

[2 of 2 positions shown; findings below may reference images not displayed]

FINDINGS: The heart size and mediastinal contours are within normal limits.
Both lungs are clear. The visualized skeletal structures are
unremarkable.
IMPRESSION: No active cardiopulmonary disease.

## 2022-12-22 ENCOUNTER — Ambulatory Visit (INDEPENDENT_AMBULATORY_CARE_PROVIDER_SITE_OTHER): Payer: BC Managed Care – PPO | Admitting: Family Medicine

## 2022-12-22 VITALS — BP 112/72 | HR 85 | Temp 98.5°F | Wt 195.0 lb

## 2022-12-22 DIAGNOSIS — D170 Benign lipomatous neoplasm of skin and subcutaneous tissue of head, face and neck: Secondary | ICD-10-CM

## 2022-12-22 NOTE — Progress Notes (Signed)
   Established Patient Office Visit   Subjective  Patient ID: Lori Blackwell, female    DOB: [birth year]  Age: 32 y.o. MRN: [id number]  Chief Complaint  Patient presents with   Mass    On back between shoulders. Noticed it a few yrs ago, did see someone about it, but would like to revisit.    Patient is a 32 year old female seen for follow-up on chronic concerns.  Patient would like lump between shoulder blades removed.  Area is not painful.  Previously seen several years ago for concern and sent to dermatology.  Patient states however she was seen by did not ultrasound of the area but was unsure of what it was.  Patient thinks the area may have grown slightly but also notes gaining weight with her last pregnancy.  Denies traumatic injury.  In the past patient lifted weights and the lumbar for the weights would sometimes rest against the back of her neck.  Patient was concerned area could be due to Cushing's.  Denies elevated blood pressure, purple striae, changes in face, oily skin, acne.      ROS Negative unless stated above    Objective:     BP 112/72 (BP Location: Left Arm, Patient Position: Sitting, Cuff Size: Normal)   Pulse 85   Temp 98.5 F (36.9 C) (Oral)   Wt 195 lb (88.5 kg)   SpO2 96%   BMI 30.54 kg/m    Physical Exam Constitutional:      General: She is not in acute distress.    Appearance: Normal appearance.  HENT:     Head: Normocephalic and atraumatic.     Nose: Nose normal.     Mouth/Throat:     Mouth: Mucous membranes are moist.  Cardiovascular:     Rate and Rhythm: Normal rate and regular rhythm.     Heart sounds: Normal heart sounds. No murmur heard.    No gallop.  Pulmonary:     Effort: Pulmonary effort is normal. No respiratory distress.     Breath sounds: Normal breath sounds. No wheezing, rhonchi or rales.  Musculoskeletal:       Arms:     Comments: Mobile soft mass in midline at base of neck/upper back.  No erythema, fluctuance, or drainage  noted.  Skin:    General: Skin is warm and dry.  Neurological:     Mental Status: She is alert and oriented to person, place, and time.      No results found for any visits on 12/22/22.    Assessment & Plan:  Lipoma of neck -     Ambulatory referral to Dermatology  Area on posterior neck likely 2/2 lipoma.  Also consider cyst though not fluctuant.  Area less likely associated with Cushing's syndrome as patient does not have any other symptoms.  Referral to dermatology placed for removal.  For further concerns regarding mass consistency and depth obtain CT.  Return if symptoms worsen or fail to improve.   Deeann Saint, MD

## 2023-01-10 ENCOUNTER — Encounter: Payer: Self-pay | Admitting: Family Medicine

## 2023-01-21 ENCOUNTER — Ambulatory Visit (INDEPENDENT_AMBULATORY_CARE_PROVIDER_SITE_OTHER): Payer: BC Managed Care – PPO | Admitting: Family Medicine

## 2023-01-21 VITALS — BP 116/78 | HR 85 | Temp 98.4°F | Wt 192.6 lb

## 2023-01-21 DIAGNOSIS — Z91013 Allergy to seafood: Secondary | ICD-10-CM | POA: Diagnosis not present

## 2023-01-21 DIAGNOSIS — J0140 Acute pansinusitis, unspecified: Secondary | ICD-10-CM

## 2023-01-21 MED ORDER — EPINEPHRINE 0.3 MG/0.3ML IJ SOAJ: 0.3000 mg | INTRAMUSCULAR | 2 refills | Status: AC | PRN

## 2023-01-21 MED ORDER — AMOXICILLIN 875 MG PO TABS: 875.0000 mg | ORAL_TABLET | Freq: Two times a day (BID) | ORAL | 0 refills | Status: AC

## 2023-01-21 NOTE — Progress Notes (Signed)
   Established Patient Office Visit   Subjective  Patient ID: Lori Blackwell, female    DOB: Feb 26, 1991  Age: 32 y.o. MRN: [id number]  Chief Complaint  Patient presents with   Sore Throat    Going on 10-11 days, sore throat and congestion. Hard to swallow, congestion is thick and clear. Has not tried anything due to nursing.     Patient is a 32 year old female who presents for acute concern.  Patient endorses nasal congestion, headache, ears popping, nasal drainage, and really bad sore throat x 11 days.  Patient was afraid to take any medications as she is currently breast-feeding.  Patient notes a history of sinus/nasal surgery years ago which helped some.  Sore Throat       ROS Negative unless stated above    Objective:     BP 116/78 (BP Location: Right Arm, Patient Position: Sitting, Cuff Size: Large)   Pulse 85   Temp 98.4 F (36.9 C) (Oral)   Wt 192 lb 9.6 oz (87.4 kg)   SpO2 98%   Breastfeeding Yes   BMI 30.17 kg/m    Physical Exam Constitutional:      General: She is not in acute distress.    Appearance: Normal appearance.  HENT:     Head: Normocephalic and atraumatic.     Comments: Bilateral TMs full.    Nose:     Right Sinus: Maxillary sinus tenderness and frontal sinus tenderness present.     Left Sinus: Maxillary sinus tenderness and frontal sinus tenderness present.     Mouth/Throat:     Mouth: Mucous membranes are moist.     Pharynx: Posterior oropharyngeal erythema present.     Comments: Postnasal drainage. Cardiovascular:     Rate and Rhythm: Normal rate and regular rhythm.     Heart sounds: Normal heart sounds. No murmur heard.    No gallop.  Pulmonary:     Effort: Pulmonary effort is normal. No respiratory distress.     Breath sounds: Normal breath sounds. No wheezing, rhonchi or rales.  Skin:    General: Skin is warm and dry.  Neurological:     Mental Status: She is alert and oriented to person, place, and time.      No results found  for any visits on 01/21/23.    Assessment & Plan:  Acute pansinusitis, recurrence not specified -     Amoxicillin; Take 1 tablet (875 mg total) by mouth 2 (two) times daily for 7 days.  Dispense: 14 tablet; Refill: 0  Shellfish allergy -     EPINEPHrine; Inject 0.3 mg into the muscle as needed.  Dispense: 2 each; Refill: 2   Start ABX for sinusitis.  Can also use saline nasal rinse and Tylenol.  EpiPen refilled for history of shellfish allergy.  Follow-up as needed for continued or worsening symptoms.   No follow-ups on file.   Deeann Saint, MD

## 2023-02-04 DIAGNOSIS — Z3042 Encounter for surveillance of injectable contraceptive: Secondary | ICD-10-CM | POA: Diagnosis not present

## 2023-02-07 ENCOUNTER — Other Ambulatory Visit: Payer: Self-pay

## 2023-02-07 ENCOUNTER — Other Ambulatory Visit: Payer: Self-pay | Admitting: Family Medicine

## 2023-02-07 DIAGNOSIS — J0140 Acute pansinusitis, unspecified: Secondary | ICD-10-CM

## 2023-03-01 ENCOUNTER — Encounter: Payer: Self-pay | Admitting: Family Medicine

## 2023-03-01 DIAGNOSIS — D179 Benign lipomatous neoplasm, unspecified: Secondary | ICD-10-CM

## 2023-03-01 DIAGNOSIS — N760 Acute vaginitis: Secondary | ICD-10-CM | POA: Diagnosis not present

## 2023-03-01 DIAGNOSIS — R350 Frequency of micturition: Secondary | ICD-10-CM | POA: Diagnosis not present

## 2023-03-03 ENCOUNTER — Other Ambulatory Visit: Payer: Self-pay

## 2023-03-03 ENCOUNTER — Ambulatory Visit: Payer: BC Managed Care – PPO | Attending: Obstetrics and Gynecology | Admitting: Physical Therapy

## 2023-03-03 ENCOUNTER — Encounter: Payer: Self-pay | Admitting: Physical Therapy

## 2023-03-03 DIAGNOSIS — R279 Unspecified lack of coordination: Secondary | ICD-10-CM | POA: Diagnosis not present

## 2023-03-03 DIAGNOSIS — M62838 Other muscle spasm: Secondary | ICD-10-CM

## 2023-03-03 DIAGNOSIS — M6281 Muscle weakness (generalized): Secondary | ICD-10-CM

## 2023-03-03 NOTE — Therapy (Signed)
OUTPATIENT PHYSICAL THERAPY FEMALE PELVIC EVALUATION   Patient Name: Lori Blackwell MRN: [id number] DOB:01/07/1991, 32 y.o., female Today's Date: 03/03/2023  END OF SESSION:  PT End of Session - 03/03/23 1615     Visit Number 1    Date for PT Re-Evaluation 05/26/23    Authorization Type BCBS    PT Start Time 1615    PT Stop Time 1645    PT Time Calculation (min) 30 min    Activity Tolerance Patient tolerated treatment well    Behavior During Therapy WFL for tasks assessed/performed             Past Medical History:  Diagnosis Date   Allergy    Asthma    SEASONAL    Bronchitis    Chicken pox    Chlamydia    from prenatal   Complication of anesthesia    HARD TIME WAKING UP   Dysrhythmia    H/O IN COLLEGE-HAD EKG WHICH PT STATES WAS NORMAL-NO PROBLEMS SINCE   Heart murmur    BORN WITH HEART MURMUR-OUTGREW-ASYMPTOMATIC   HPV (human papilloma virus) infection    from prenatal   Kidney stone    PONV (postoperative nausea and vomiting)    Past Surgical History:  Procedure Laterality Date   APPENDECTOMY  2001   BREAST BIOPSY  2020   fibroabnormal   CESAREAN SECTION N/A 04/15/2021   Procedure: CESAREAN SECTION;  Surgeon: Marlow Baars, MD;  Location: [facility];  Service: Obstetrics;  Laterality: N/A;   CESAREAN SECTION N/A 10/08/2022   Procedure: CESAREAN SECTION;  Surgeon: Marlow Baars, MD;  Location: [facility];  Service: Obstetrics;  Laterality: N/A;   LEEP     NASAL SINUS SURGERY N/A 11/13/2015   Procedure: ENDOSCOPIC SINUS SURGERY;  Surgeon: Bud Face, MD;  Location: [facility];  Service: ENT;  Laterality: N/A;   SEPTOPLASTY WITH ETHMOIDECTOMY, AND MAXILLARY ANTROSTOMY Bilateral 11/13/2015   Procedure: SEPTOPLASTY WITH ETHMOIDECTOMY, AND MAXILLARY ANTROSTOMY;  Surgeon: Bud Face, MD;  Location: [facility];  Service: ENT;  Laterality: Bilateral;  bilateral maxillary antrostomy, left anterior ethmoidectomy, left frontal sinusotomy   SHOULDER SURGERY Right  2008   2011   TURBINATE REDUCTION Bilateral 11/13/2015   Procedure: TURBINATE REDUCTION;  Surgeon: Bud Face, MD;  Location: [facility];  Service: ENT;  Laterality: Bilateral;   WISDOM TOOTH EXTRACTION     Patient Active Problem List   Diagnosis Date Noted   History of cesarean delivery, antepartum 10/08/2022   History of cesarean delivery 10/08/2022   Full-term premature rupture of membranes (PROM) with unknown onset of labor 04/15/2021   Irritable bowel syndrome with both constipation and diarrhea 01/19/2019   Acute upper respiratory infection 12/16/2015   ASCUS favoring benign 12/05/2015   Recurrent sinusitis 09/16/2015    PCP: Abbe Amsterdam, MD  REFERRING PROVIDER:   Carrington Clamp, MD    REFERRING DIAG: K62.89 (ICD-10-CM) - Other specified diseases of anus and rectum   THERAPY DIAG:  Muscle weakness (generalized)  Other muscle spasm  Unspecified lack of coordination  Abnormal posture  Rationale for Evaluation and Treatment: Rehabilitation  ONSET DATE: 10/08/22  SUBJECTIVE:  SUBJECTIVE STATEMENT: Pt has anorectal pain when sitting, with gas, when voiding.  Currently breast feeding Fluid intake: Yes: 90 oz    PAIN:  Are you having pain? No NPRS scale: 3-7/10 Pain location: Anal  Pain type: pressure and sharp shooting Pain description: intermittent   Aggravating factors: sitting, with gas, when voiding Relieving factors: standing  PRECAUTIONS: None  RED FLAGS: None   WEIGHT BEARING RESTRICTIONS: No  FALLS:  Has patient fallen in last 6 months? No  LIVING ENVIRONMENT: Lives with: lives with their spouse and daughter, infant Lives in: House/apartment   OCCUPATION: Education officer, environmental for Biomedical engineer- desk  PLOF: Independent  PATIENT GOALS: get rid of  the pain  PERTINENT HISTORY:  Recent postpartum c-section, c-section x 2 Sexual abuse: No  BOWEL MOVEMENT: Pain with bowel movement: Yes Type of bowel movement:Type (Bristol Stool Scale) 1-7, Frequency 3/day, and Strain No not normally Fully empty rectum: Yes:   Leakage: No Pads: No Fiber supplement: No  URINATION: No issues  INTERCOURSE: Pain with intercourse: After Intercourse (normal after having baby having some burning/dryness) doesn't usually happen   PREGNANCY:  C-section deliveries 2 Currently pregnant No  PROLAPSE: None   OBJECTIVE:   DIAGNOSTIC FINDINGS:    PATIENT SURVEYS:     COGNITION: Overall cognitive status: Within functional limits for tasks assessed     SENSATION: Light touch: Appears intact Proprioception: Appears intact  MUSCLE LENGTH: Hamstrings: Right 60 deg; Left 70 deg Thomas test:normal  LUMBAR SPECIAL TESTS:  Active straight leg raise improved with compression and worsened with back support  FUNCTIONAL TESTS:  Single leg stand - normal  GAIT:  Comments: WFL   POSTURE: No Significant postural limitations  PELVIC ALIGNMENT:  LUMBARAROM/PROM:  A/PROM A/PROM  eval  Flexion 75%  Extension   Right lateral flexion   Left lateral flexion   Right rotation   Left rotation    (Blank rows = not tested)  LOWER EXTREMITY ROM:  Passive ROM Right eval Left eval  Hip flexion    Hip extension    Hip abduction    Hip adduction    Hip internal rotation    Hip external rotation    Knee flexion 80% 80%  Knee extension    Ankle dorsiflexion    Ankle plantarflexion    Ankle inversion    Ankle eversion     (Blank rows = not tested)  LOWER EXTREMITY MMT:  MMT Right eval Left eval  Hip flexion 5 5  Hip extension 4/5 5  Hip abduction 4/5 5  Hip adduction 5 5  Hip internal rotation 5 5  Hip external rotation 5 5  Knee flexion    Knee extension    Ankle dorsiflexion    Ankle plantarflexion    Ankle inversion     Ankle eversion     PALPATION:   General  TTP coccyx, restricted scar at pubis still pink in color but well healed                External Perineal Exam normal                              Internal Pelvic Floor very TTP bil coccygeus and coccyx bone and ligament  Patient confirms identification and approves PT to assess internal pelvic floor and treatment No  PELVIC MMT:   MMT eval  Vaginal   Internal Anal Sphincter 4/5  External Anal Sphincter  Puborectalis   Diastasis Recti One finger width and knuckle deep  (Blank rows = not tested)        TONE: Normal to high due to pain  PROLAPSE: Not noted  TODAY'S TREATMENT:                                                                                                                              DATE: 03/03/23  EVAL and initial HEP all performed and educated as seen below   PATIENT EDUCATION:  Education details: Access Code: [email or web address] Person educated: Patient Education method: Explanation, Demonstration, Actor cues, Verbal cues, and Handouts Education comprehension: verbalized understanding and returned demonstration  HOME EXERCISE PROGRAM: Access Code: [email or web address] URL: [email or web address] Date: 03/03/2023 Prepared by: Dwana Curd  Exercises - Cat Cow  - 1 x daily - 7 x weekly - 3 sets - 10 reps - Weight shift quadruped  - 1 x daily - 7 x weekly - 3 sets - 10 reps - Quadruped Thoracic Rotation Full Range with Hand on Neck  - 1 x daily - 7 x weekly - 3 sets - 10 reps  Patient Education - Trigger Point Dry Needling  ASSESSMENT:  CLINICAL IMPRESSION: Patient is a 32 y.o. female who was seen today for physical therapy evaluation and treatment for anorectal/coccyx pain.  Pt had 2 c-sections and presents with some core and hip weakness.  Pt has tight and tender muscles in posterior pelvic floor coccygeus and piriformis muscles.  Pt is likely compensating for weakness and creating inflammation in  pelvic floor muscle attachments. Pt was given initial HEP and will benefit from skilled PT to address all impairments mentioned above for improved function and reduced pain.  OBJECTIVE IMPAIRMENTS: decreased coordination, decreased endurance, decreased ROM, decreased strength, increased fascial restrictions, increased muscle spasms, impaired flexibility, impaired tone, and pain.   ACTIVITY LIMITATIONS: carrying, lifting, sitting, and toileting  PARTICIPATION LIMITATIONS: driving and community activity  PERSONAL FACTORS: 1-2 comorbidities: 2 c-sections  are also affecting patient's functional outcome.   REHAB POTENTIAL: Excellent  CLINICAL DECISION MAKING: Stable/uncomplicated  EVALUATION COMPLEXITY: Low   GOALS: Goals reviewed with patient? Yes  SHORT TERM GOALS: Target date: 03/31/23  Ind with initial HEP Baseline: Goal status: INITIAL   LONG TERM GOALS: Target date: 05/26/23  Pt will be independent with advanced HEP to maintain improvements made throughout therapy  Baseline:  Goal status: INITIAL  2.  Pt will report 80% reduction of pain due to improvements in posture, strength, and muscle length  Baseline:  Goal status: INITIAL  3.  Pt will be able to return to normal exercises routine without pain Baseline:  Goal status: INITIAL  4.  Pt will not have pain before, during, or after voiding or having a bowel movement. Baseline:  Goal status: INITIAL  PLAN:  PT FREQUENCY: 1x/week  PT DURATION: 12 weeks  PLANNED INTERVENTIONS: Therapeutic exercises, Therapeutic activity, Neuromuscular re-education, Balance training, Gait training, Patient/Family education, Self Care,  Joint mobilization, Dry Needling, Electrical stimulation, Spinal manipulation, Spinal mobilization, Cryotherapy, Moist heat, Taping, Traction, Biofeedback, Manual therapy, and Re-evaluation  PLAN FOR NEXT SESSION: dry needling or deep tissue to piriformis bil, breathing and bulging posterior pelvic  floor and hamstring stretches, transversus abdominus activation   Brayton Caves Hiliana Eilts, PT 03/03/2023, 4:16 PM

## 2023-03-18 ENCOUNTER — Ambulatory Visit: Payer: BC Managed Care – PPO | Attending: Obstetrics and Gynecology | Admitting: Physical Therapy

## 2023-03-18 DIAGNOSIS — M6281 Muscle weakness (generalized): Secondary | ICD-10-CM | POA: Insufficient documentation

## 2023-03-18 DIAGNOSIS — R279 Unspecified lack of coordination: Secondary | ICD-10-CM | POA: Diagnosis not present

## 2023-03-18 DIAGNOSIS — R293 Abnormal posture: Secondary | ICD-10-CM | POA: Diagnosis not present

## 2023-03-18 DIAGNOSIS — M62838 Other muscle spasm: Secondary | ICD-10-CM | POA: Insufficient documentation

## 2023-03-18 NOTE — Therapy (Signed)
OUTPATIENT PHYSICAL THERAPY FEMALE PELVIC TREATMENT   Patient Name: Lori Blackwell MRN: [id number] DOB:June 01, 1991, 32 y.o., female Today's Date: 03/18/2023  END OF SESSION:  PT End of Session - 03/18/23 0801     Visit Number 2    Date for PT Re-Evaluation 05/26/23    Authorization Type BCBS    PT Start Time 0800    PT Stop Time 0840    PT Time Calculation (min) 40 min    Activity Tolerance Patient tolerated treatment well    Behavior During Therapy WFL for tasks assessed/performed             Past Medical History:  Diagnosis Date   Allergy    Asthma    SEASONAL    Bronchitis    Chicken pox    Chlamydia    from prenatal   Complication of anesthesia    HARD TIME WAKING UP   Dysrhythmia    H/O IN COLLEGE-HAD EKG WHICH PT STATES WAS NORMAL-NO PROBLEMS SINCE   Heart murmur    BORN WITH HEART MURMUR-OUTGREW-ASYMPTOMATIC   HPV (human papilloma virus) infection    from prenatal   Kidney stone    PONV (postoperative nausea and vomiting)    Past Surgical History:  Procedure Laterality Date   APPENDECTOMY  2001   BREAST BIOPSY  2020   fibroabnormal   CESAREAN SECTION N/A 04/15/2021   Procedure: CESAREAN SECTION;  Surgeon: Marlow Baars, MD;  Location: [facility];  Service: Obstetrics;  Laterality: N/A;   CESAREAN SECTION N/A 10/08/2022   Procedure: CESAREAN SECTION;  Surgeon: Marlow Baars, MD;  Location: [facility];  Service: Obstetrics;  Laterality: N/A;   LEEP     NASAL SINUS SURGERY N/A 11/13/2015   Procedure: ENDOSCOPIC SINUS SURGERY;  Surgeon: Bud Face, MD;  Location: [facility];  Service: ENT;  Laterality: N/A;   SEPTOPLASTY WITH ETHMOIDECTOMY, AND MAXILLARY ANTROSTOMY Bilateral 11/13/2015   Procedure: SEPTOPLASTY WITH ETHMOIDECTOMY, AND MAXILLARY ANTROSTOMY;  Surgeon: Bud Face, MD;  Location: [facility];  Service: ENT;  Laterality: Bilateral;  bilateral maxillary antrostomy, left anterior ethmoidectomy, left frontal sinusotomy   SHOULDER SURGERY Right  2008   2011   TURBINATE REDUCTION Bilateral 11/13/2015   Procedure: TURBINATE REDUCTION;  Surgeon: Bud Face, MD;  Location: [facility];  Service: ENT;  Laterality: Bilateral;   WISDOM TOOTH EXTRACTION     Patient Active Problem List   Diagnosis Date Noted   History of cesarean delivery, antepartum 10/08/2022   History of cesarean delivery 10/08/2022   Full-term premature rupture of membranes (PROM) with unknown onset of labor 04/15/2021   Irritable bowel syndrome with both constipation and diarrhea 01/19/2019   Acute upper respiratory infection 12/16/2015   ASCUS favoring benign 12/05/2015   Recurrent sinusitis 09/16/2015    PCP: Abbe Amsterdam, MD  REFERRING PROVIDER:   Carrington Clamp, MD    REFERRING DIAG: K62.89 (ICD-10-CM) - Other specified diseases of anus and rectum   THERAPY DIAG:  Muscle weakness (generalized)  Other muscle spasm  Unspecified lack of coordination  Abnormal posture  Rationale for Evaluation and Treatment: Rehabilitation  ONSET DATE: 10/08/22  SUBJECTIVE:  SUBJECTIVE STATEMENT: Had a lot of relief after last session, and just started coming back a little bit.  Does report pubic bone pain is worse now that she is trying to return to running ~73miles. Especially after.  Fluid intake: Yes: 90 oz    PAIN:  Are you having pain? No NPRS scale: 3-7/10 Pain location: Anal  Pain type: pressure and sharp shooting Pain description: intermittent   Aggravating factors: sitting, with gas, when voiding Relieving factors: standing  PRECAUTIONS: None  RED FLAGS: None   WEIGHT BEARING RESTRICTIONS: No  FALLS:  Has patient fallen in last 6 months? No  LIVING ENVIRONMENT: Lives with: lives with their spouse and daughter, infant Lives in:  House/apartment   OCCUPATION: Education officer, environmental for Biomedical engineer- desk  PLOF: Independent  PATIENT GOALS: get rid of the pain  PERTINENT HISTORY:  Recent postpartum c-section, c-section x 2 Sexual abuse: No  BOWEL MOVEMENT: Pain with bowel movement: Yes Type of bowel movement:Type (Bristol Stool Scale) 1-7, Frequency 3/day, and Strain No not normally Fully empty rectum: Yes:   Leakage: No Pads: No Fiber supplement: No  URINATION: No issues  INTERCOURSE: Pain with intercourse: After Intercourse (normal after having baby having some burning/dryness) doesn't usually happen   PREGNANCY:  C-section deliveries 2 Currently pregnant No  PROLAPSE: None   OBJECTIVE:   DIAGNOSTIC FINDINGS:    PATIENT SURVEYS:     COGNITION: Overall cognitive status: Within functional limits for tasks assessed     SENSATION: Light touch: Appears intact Proprioception: Appears intact  MUSCLE LENGTH: Hamstrings: Right 60 deg; Left 70 deg Thomas test:normal  LUMBAR SPECIAL TESTS:  Active straight leg raise improved with compression and worsened with back support  FUNCTIONAL TESTS:  Single leg stand - normal  GAIT:  Comments: WFL   POSTURE: No Significant postural limitations  PELVIC ALIGNMENT:  LUMBARAROM/PROM:  A/PROM A/PROM  eval  Flexion 75%  Extension   Right lateral flexion   Left lateral flexion   Right rotation   Left rotation    (Blank rows = not tested)  LOWER EXTREMITY ROM:  Passive ROM Right eval Left eval  Hip flexion    Hip extension    Hip abduction    Hip adduction    Hip internal rotation    Hip external rotation    Knee flexion 80% 80%  Knee extension    Ankle dorsiflexion    Ankle plantarflexion    Ankle inversion    Ankle eversion     (Blank rows = not tested)  LOWER EXTREMITY MMT:  MMT Right eval Left eval  Hip flexion 5 5  Hip extension 4/5 5  Hip abduction 4/5 5  Hip adduction 5 5  Hip internal rotation 5 5   Hip external rotation 5 5  Knee flexion    Knee extension    Ankle dorsiflexion    Ankle plantarflexion    Ankle inversion    Ankle eversion     PALPATION:   General  TTP coccyx, restricted scar at pubis still pink in color but well healed                External Perineal Exam normal                              Internal Pelvic Floor very TTP bil coccygeus and coccyx bone and ligament  Patient confirms identification and approves PT to assess internal pelvic floor and  treatment No  PELVIC MMT:   MMT eval  Vaginal   Internal Anal Sphincter 4/5  External Anal Sphincter   Puborectalis   Diastasis Recti One finger width and knuckle deep  (Blank rows = not tested)        TONE: Normal to high due to pain  PROLAPSE: Not noted  TODAY'S TREATMENT:                                                                                                                              DATE: 03/03/23  03/03/23 : EVAL and initial HEP all performed and educated as seen below  03/18/23  Manual at Rt medial glute and tail bone mobility and soft tissue release. Pt had tension noted at Rt side more than lt and did have tenderness initially but relieved with soft tissue mobility. Patient consented to internal pelvic floor treatment rectally this date and continued to have tension throughout posterior pelvic floor. Trigger point release completed at Rt side with more tension here than lt noted today, gentle stretching and trigger point release completed within pt's tolerance. Pt verbalized feeling very tight and tenderness noted with release work but pt reported no increase in pain. Did have fair release noted but not resolved. Gentle mobility at coccyx P>A with improvement noted with mobility  PATIENT EDUCATION:  Education details: Access Code: [email or web address] Person educated: Patient Education method: Explanation, Demonstration, Tactile cues, Verbal cues, and Handouts Education comprehension: verbalized  understanding and returned demonstration  HOME EXERCISE PROGRAM: Access Code: [email or web address] URL: [email or web address] Date: 03/03/2023 Prepared by: Dwana Curd  Exercises - Cat Cow  - 1 x daily - 7 x weekly - 3 sets - 10 reps - Weight shift quadruped  - 1 x daily - 7 x weekly - 3 sets - 10 reps - Quadruped Thoracic Rotation Full Range with Hand on Neck  - 1 x daily - 7 x weekly - 3 sets - 10 reps  Patient Education - Trigger Point Dry Needling  ASSESSMENT:  CLINICAL IMPRESSION: Patient presents for treatment. Reported feel so much better after internal at eval. Consented to internal today and did have good response to manual work and had release but not fully. Pt reports she would also like to return to running as she has been trying and has had worse pelvic pain and feels weaker. Pelvic floor is likely compensating for weakness with single leg activity. Pt was given initial HEP and will benefit from skilled PT to address all impairments mentioned above for improved function and reduced pain.  OBJECTIVE IMPAIRMENTS: decreased coordination, decreased endurance, decreased ROM, decreased strength, increased fascial restrictions, increased muscle spasms, impaired flexibility, impaired tone, and pain.   ACTIVITY LIMITATIONS: carrying, lifting, sitting, and toileting  PARTICIPATION LIMITATIONS: driving and community activity  PERSONAL FACTORS: 1-2 comorbidities: 2 c-sections  are also affecting patient's functional outcome.   REHAB POTENTIAL: Excellent  CLINICAL DECISION MAKING: Stable/uncomplicated  EVALUATION COMPLEXITY: Low   GOALS:  Goals reviewed with patient? Yes  SHORT TERM GOALS: Target date: 03/31/23  Ind with initial HEP Baseline: Goal status: INITIAL   LONG TERM GOALS: Target date: 05/26/23  Pt will be independent with advanced HEP to maintain improvements made throughout therapy  Baseline:  Goal status: INITIAL  2.  Pt will report 80%  reduction of pain due to improvements in posture, strength, and muscle length  Baseline:  Goal status: INITIAL  3.  Pt will be able to return to normal exercises routine without pain Baseline:  Goal status: INITIAL  4.  Pt will not have pain before, during, or after voiding or having a bowel movement. Baseline:  Goal status: INITIAL  PLAN:  PT FREQUENCY: 1x/week  PT DURATION: 12 weeks  PLANNED INTERVENTIONS: Therapeutic exercises, Therapeutic activity, Neuromuscular re-education, Balance training, Gait training, Patient/Family education, Self Care, Joint mobilization, Dry Needling, Electrical stimulation, Spinal manipulation, Spinal mobilization, Cryotherapy, Moist heat, Taping, Traction, Biofeedback, Manual therapy, and Re-evaluation  PLAN FOR NEXT SESSION: dry needling or deep tissue to piriformis bil, breathing and bulging posterior pelvic floor and hamstring stretches, transversus abdominus activation   Barbaraann Faster, PT 03/18/2023, 9:17 AM

## 2023-03-23 ENCOUNTER — Ambulatory Visit: Payer: BC Managed Care – PPO | Admitting: Physical Therapy

## 2023-03-28 ENCOUNTER — Ambulatory Visit: Payer: BC Managed Care – PPO | Admitting: Physical Therapy

## 2023-03-28 DIAGNOSIS — M62838 Other muscle spasm: Secondary | ICD-10-CM | POA: Diagnosis not present

## 2023-03-28 DIAGNOSIS — R293 Abnormal posture: Secondary | ICD-10-CM | POA: Diagnosis not present

## 2023-03-28 DIAGNOSIS — M6281 Muscle weakness (generalized): Secondary | ICD-10-CM | POA: Diagnosis not present

## 2023-03-28 DIAGNOSIS — R279 Unspecified lack of coordination: Secondary | ICD-10-CM | POA: Diagnosis not present

## 2023-03-28 NOTE — Therapy (Signed)
OUTPATIENT PHYSICAL THERAPY FEMALE PELVIC TREATMENT   Patient Name: Lori Blackwell MRN: [id number] DOB:04/25/1991, 32 y.o., female Today's Date: 03/28/2023  END OF SESSION:  PT End of Session - 03/28/23 0804     Visit Number 3    Date for PT Re-Evaluation 05/26/23    Authorization Type BCBS    PT Start Time 0801    PT Stop Time 0839    PT Time Calculation (min) 38 min    Activity Tolerance Patient tolerated treatment well    Behavior During Therapy WFL for tasks assessed/performed             Past Medical History:  Diagnosis Date   Allergy    Asthma    SEASONAL    Bronchitis    Chicken pox    Chlamydia    from prenatal   Complication of anesthesia    HARD TIME WAKING UP   Dysrhythmia    H/O IN COLLEGE-HAD EKG WHICH PT STATES WAS NORMAL-NO PROBLEMS SINCE   Heart murmur    BORN WITH HEART MURMUR-OUTGREW-ASYMPTOMATIC   HPV (human papilloma virus) infection    from prenatal   Kidney stone    PONV (postoperative nausea and vomiting)    Past Surgical History:  Procedure Laterality Date   APPENDECTOMY  2001   BREAST BIOPSY  2020   fibroabnormal   CESAREAN SECTION N/A 04/15/2021   Procedure: CESAREAN SECTION;  Surgeon: Marlow Baars, MD;  Location: [facility];  Service: Obstetrics;  Laterality: N/A;   CESAREAN SECTION N/A 10/08/2022   Procedure: CESAREAN SECTION;  Surgeon: Marlow Baars, MD;  Location: [facility];  Service: Obstetrics;  Laterality: N/A;   LEEP     NASAL SINUS SURGERY N/A 11/13/2015   Procedure: ENDOSCOPIC SINUS SURGERY;  Surgeon: Bud Face, MD;  Location: [facility];  Service: ENT;  Laterality: N/A;   SEPTOPLASTY WITH ETHMOIDECTOMY, AND MAXILLARY ANTROSTOMY Bilateral 11/13/2015   Procedure: SEPTOPLASTY WITH ETHMOIDECTOMY, AND MAXILLARY ANTROSTOMY;  Surgeon: Bud Face, MD;  Location: [facility];  Service: ENT;  Laterality: Bilateral;  bilateral maxillary antrostomy, left anterior ethmoidectomy, left frontal sinusotomy   SHOULDER SURGERY Right  2008   2011   TURBINATE REDUCTION Bilateral 11/13/2015   Procedure: TURBINATE REDUCTION;  Surgeon: Bud Face, MD;  Location: [facility];  Service: ENT;  Laterality: Bilateral;   WISDOM TOOTH EXTRACTION     Patient Active Problem List   Diagnosis Date Noted   History of cesarean delivery, antepartum 10/08/2022   History of cesarean delivery 10/08/2022   Full-term premature rupture of membranes (PROM) with unknown onset of labor 04/15/2021   Irritable bowel syndrome with both constipation and diarrhea 01/19/2019   Acute upper respiratory infection 12/16/2015   ASCUS favoring benign 12/05/2015   Recurrent sinusitis 09/16/2015    PCP: Abbe Amsterdam, MD  REFERRING PROVIDER:   Carrington Clamp, MD    REFERRING DIAG: K62.89 (ICD-10-CM) - Other specified diseases of anus and rectum   THERAPY DIAG:  Muscle weakness (generalized)  Other muscle spasm  Rationale for Evaluation and Treatment: Rehabilitation  ONSET DATE: 10/08/22  SUBJECTIVE:  SUBJECTIVE STATEMENT: Had soreness post internal work last session but then felt much better. Pain increases after running at home.   Fluid intake: Yes: 90 oz    PAIN:  Are you having pain? No NPRS scale: 3-7/10 Pain location: Anal  Pain type: pressure and sharp shooting Pain description: intermittent   Aggravating factors: sitting, with gas, when voiding Relieving factors: standing  PRECAUTIONS: None  RED FLAGS: None   WEIGHT BEARING RESTRICTIONS: No  FALLS:  Has patient fallen in last 6 months? No  LIVING ENVIRONMENT: Lives with: lives with their spouse and daughter, infant Lives in: House/apartment   OCCUPATION: Education officer, environmental for Biomedical engineer- desk  PLOF: Independent  PATIENT GOALS: get rid of the pain  PERTINENT  HISTORY:  Recent postpartum c-section, c-section x 2 Sexual abuse: No  BOWEL MOVEMENT: Pain with bowel movement: Yes Type of bowel movement:Type (Bristol Stool Scale) 1-7, Frequency 3/day, and Strain No not normally Fully empty rectum: Yes:   Leakage: No Pads: No Fiber supplement: No  URINATION: No issues  INTERCOURSE: Pain with intercourse: After Intercourse (normal after having baby having some burning/dryness) doesn't usually happen   PREGNANCY:  C-section deliveries 2 Currently pregnant No  PROLAPSE: None   OBJECTIVE:   DIAGNOSTIC FINDINGS:    PATIENT SURVEYS:     COGNITION: Overall cognitive status: Within functional limits for tasks assessed     SENSATION: Light touch: Appears intact Proprioception: Appears intact  MUSCLE LENGTH: Hamstrings: Right 60 deg; Left 70 deg Thomas test:normal  LUMBAR SPECIAL TESTS:  Active straight leg raise improved with compression and worsened with back support  FUNCTIONAL TESTS:  Single leg stand - normal  GAIT:  Comments: WFL   POSTURE: No Significant postural limitations  PELVIC ALIGNMENT:  LUMBARAROM/PROM:  A/PROM A/PROM  eval  Flexion 75%  Extension   Right lateral flexion   Left lateral flexion   Right rotation   Left rotation    (Blank rows = not tested)  LOWER EXTREMITY ROM:  Passive ROM Right eval Left eval  Hip flexion    Hip extension    Hip abduction    Hip adduction    Hip internal rotation    Hip external rotation    Knee flexion 80% 80%  Knee extension    Ankle dorsiflexion    Ankle plantarflexion    Ankle inversion    Ankle eversion     (Blank rows = not tested)  LOWER EXTREMITY MMT:  MMT Right eval Left eval  Hip flexion 5 5  Hip extension 4/5 5  Hip abduction 4/5 5  Hip adduction 5 5  Hip internal rotation 5 5  Hip external rotation 5 5  Knee flexion    Knee extension    Ankle dorsiflexion    Ankle plantarflexion    Ankle inversion    Ankle eversion      PALPATION:   General  TTP coccyx, restricted scar at pubis still pink in color but well healed                External Perineal Exam normal                              Internal Pelvic Floor very TTP bil coccygeus and coccyx bone and ligament  Patient confirms identification and approves PT to assess internal pelvic floor and treatment No  PELVIC MMT:   MMT eval  Vaginal   Internal Anal Sphincter 4/5  External Anal Sphincter   Puborectalis   Diastasis Recti One finger width and knuckle deep  (Blank rows = not tested)        TONE: Normal to high due to pain  PROLAPSE: Not noted  TODAY'S TREATMENT:                                                                                                                              DATE: 03/18/23  Manual at Rt medial glute and tail bone mobility and soft tissue release. Pt had tension noted at Rt side more than lt and did have tenderness initially but relieved with soft tissue mobility. Patient consented to internal pelvic floor treatment rectally this date and continued to have tension throughout posterior pelvic floor. Trigger point release completed at Rt side with more tension here than lt noted today, gentle stretching and trigger point release completed within pt's tolerance. Pt verbalized feeling very tight and tenderness noted with release work but pt reported no increase in pain. Did have fair release noted but not resolved. Gentle mobility at coccyx P>A with improvement noted with mobility  03/28/23  Opp hand/knee ball press 2x10 +TA activation Hip abduction 2x10 with ball press +TA activation Dead bugs 2x10 Bird dogs 2x10 Alt marching 7# OH isometric and suitcase isometric 2x10 Isometric 7# held at shoulder height 2x10 Hip machine: 55# x15 abduction, extension, flexion    PATIENT EDUCATION:  Education details: Access Code: [email or web address] Person educated: Patient Education method: Explanation, Demonstration, Tactile cues,  Verbal cues, and Handouts Education comprehension: verbalized understanding and returned demonstration  HOME EXERCISE PROGRAM: Access Code: [email or web address] URL: [email or web address] Date: 03/03/2023 Prepared by: Dwana Curd  Exercises - Cat Cow  - 1 x daily - 7 x weekly - 3 sets - 10 reps - Weight shift quadruped  - 1 x daily - 7 x weekly - 3 sets - 10 reps - Quadruped Thoracic Rotation Full Range with Hand on Neck  - 1 x daily - 7 x weekly - 3 sets - 10 reps  Patient Education - Trigger Point Dry Needling  ASSESSMENT:  CLINICAL IMPRESSION: Patient presents for treatment. Pt session focused on pelvic stabilizing exercises to decrease pain after activity. Pt reported relief after internal work last session but states pain is much worse after exercise and prolonged activity. Tolerated well but did need cues for technique and TA activation. Pt will benefit from skilled PT to address all impairments mentioned above for improved function and reduced pain.  OBJECTIVE IMPAIRMENTS: decreased coordination, decreased endurance, decreased ROM, decreased strength, increased fascial restrictions, increased muscle spasms, impaired flexibility, impaired tone, and pain.   ACTIVITY LIMITATIONS: carrying, lifting, sitting, and toileting  PARTICIPATION LIMITATIONS: driving and community activity  PERSONAL FACTORS: 1-2 comorbidities: 2 c-sections  are also affecting patient's functional outcome.   REHAB POTENTIAL: Excellent  CLINICAL DECISION MAKING: Stable/uncomplicated  EVALUATION COMPLEXITY: Low   GOALS: Goals reviewed with patient? Yes  SHORT TERM GOALS: Target  date: 03/31/23  Ind with initial HEP Baseline: Goal status: INITIAL   LONG TERM GOALS: Target date: 05/26/23  Pt will be independent with advanced HEP to maintain improvements made throughout therapy  Baseline:  Goal status: INITIAL  2.  Pt will report 80% reduction of pain due to improvements in posture,  strength, and muscle length  Baseline:  Goal status: INITIAL  3.  Pt will be able to return to normal exercises routine without pain Baseline:  Goal status: INITIAL  4.  Pt will not have pain before, during, or after voiding or having a bowel movement. Baseline:  Goal status: INITIAL  PLAN:  PT FREQUENCY: 1x/week  PT DURATION: 12 weeks  PLANNED INTERVENTIONS: Therapeutic exercises, Therapeutic activity, Neuromuscular re-education, Balance training, Gait training, Patient/Family education, Self Care, Joint mobilization, Dry Needling, Electrical stimulation, Spinal manipulation, Spinal mobilization, Cryotherapy, Moist heat, Taping, Traction, Biofeedback, Manual therapy, and Re-evaluation  PLAN FOR NEXT SESSION: dry needling or deep tissue to piriformis bil, breathing and bulging posterior pelvic floor and hamstring stretches, transversus abdominus activation   Barbaraann Faster, PT 03/28/2023, 8:45 AM

## 2023-04-01 NOTE — Telephone Encounter (Signed)
Okay for referral to plastics for possible lipoma.

## 2023-04-04 NOTE — Telephone Encounter (Signed)
Referral placed, patient is aware.  

## 2023-04-06 ENCOUNTER — Encounter: Payer: Self-pay | Admitting: Physical Therapy

## 2023-04-07 DIAGNOSIS — L858 Other specified epidermal thickening: Secondary | ICD-10-CM | POA: Diagnosis not present

## 2023-04-07 DIAGNOSIS — D170 Benign lipomatous neoplasm of skin and subcutaneous tissue of head, face and neck: Secondary | ICD-10-CM | POA: Diagnosis not present

## 2023-04-08 ENCOUNTER — Encounter: Payer: Self-pay | Admitting: Physical Therapy

## 2023-04-15 ENCOUNTER — Ambulatory Visit: Payer: BC Managed Care – PPO | Attending: Obstetrics and Gynecology | Admitting: Physical Therapy

## 2023-04-15 DIAGNOSIS — M6281 Muscle weakness (generalized): Secondary | ICD-10-CM | POA: Insufficient documentation

## 2023-04-15 DIAGNOSIS — M62838 Other muscle spasm: Secondary | ICD-10-CM | POA: Diagnosis not present

## 2023-04-15 DIAGNOSIS — R279 Unspecified lack of coordination: Secondary | ICD-10-CM | POA: Diagnosis not present

## 2023-04-15 DIAGNOSIS — R293 Abnormal posture: Secondary | ICD-10-CM | POA: Diagnosis not present

## 2023-04-15 IMAGING — US US SOFT TISSUE HEAD/NECK
1 series · 14 of 14 positions shown · non-contrast
Comparison: None Available.

CLINICAL DATA: Lipoma of the neck.  Palpable bump.

EXAM:
ULTRASOUND OF HEAD/NECK SOFT TISSUES
TECHNIQUE: Ultrasound examination of the head and neck soft tissues was
performed in the area of clinical concern.

[Series 1: us soft tissue head/neck · 0.06mm/px · 14 acquisitions, 14 frames shown]
[im 1/14]
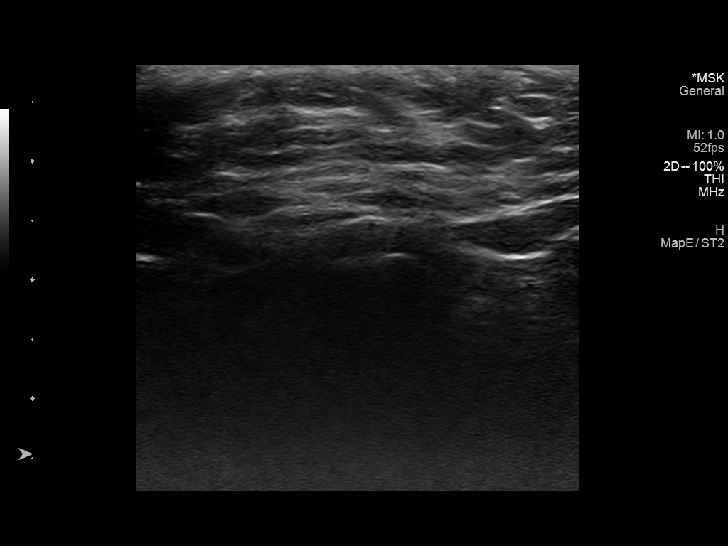
[im 2/14]
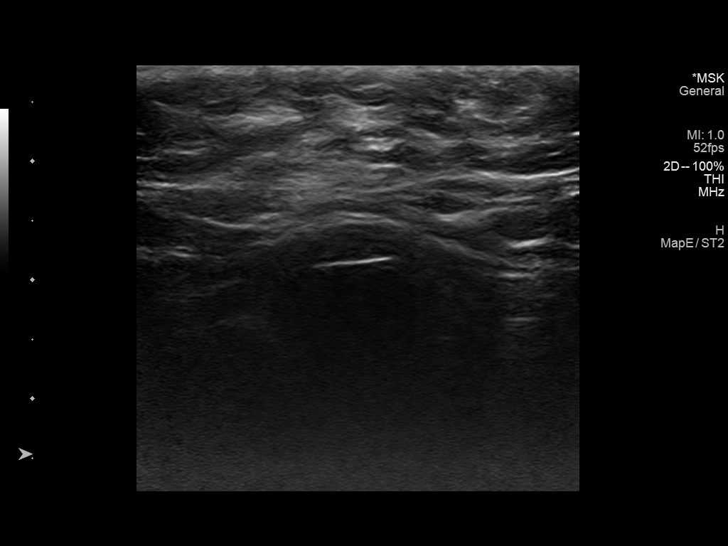
[im 3/14]
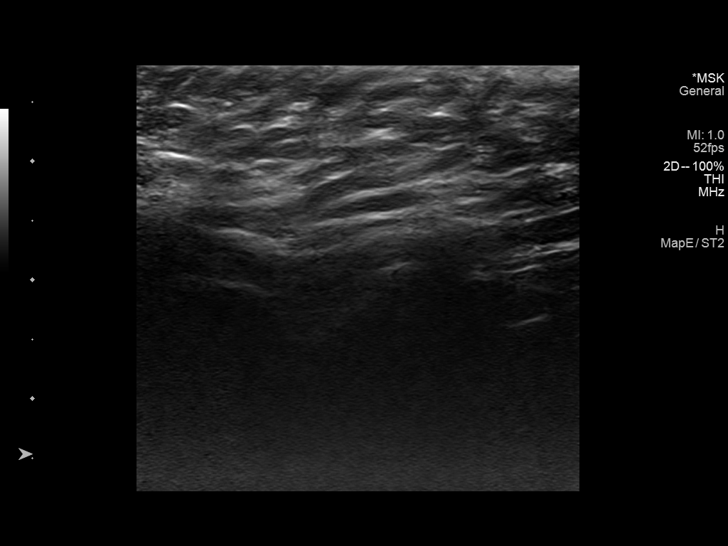
[im 4/14]
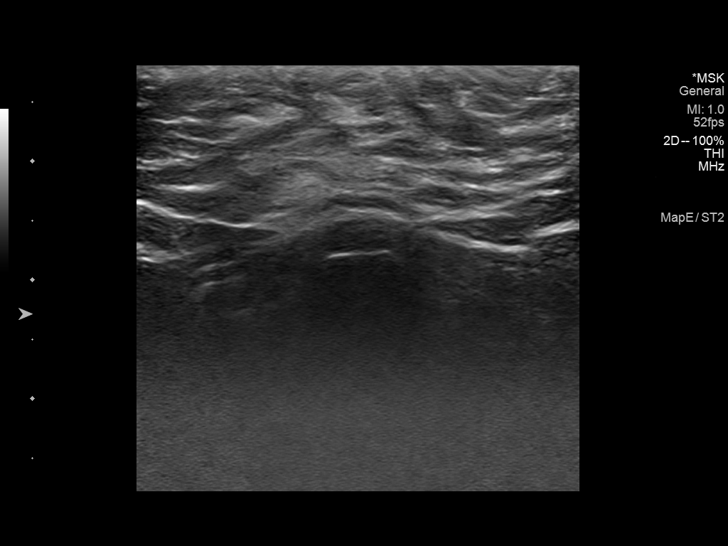
[im 5/14]
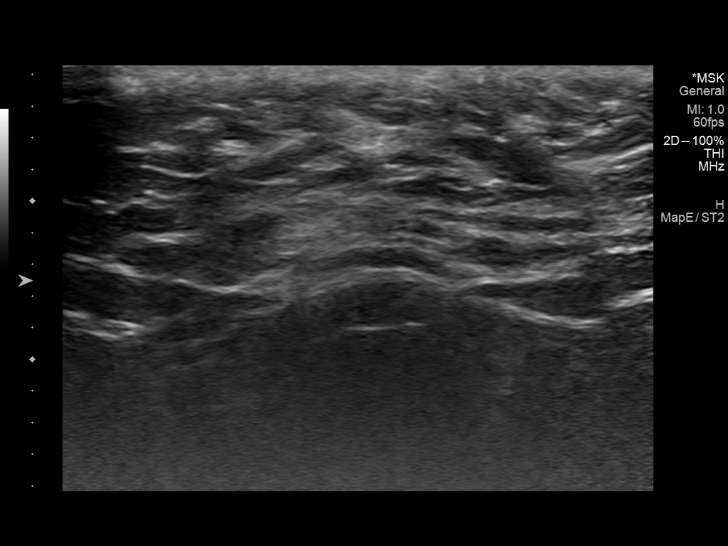
[im 6/14]
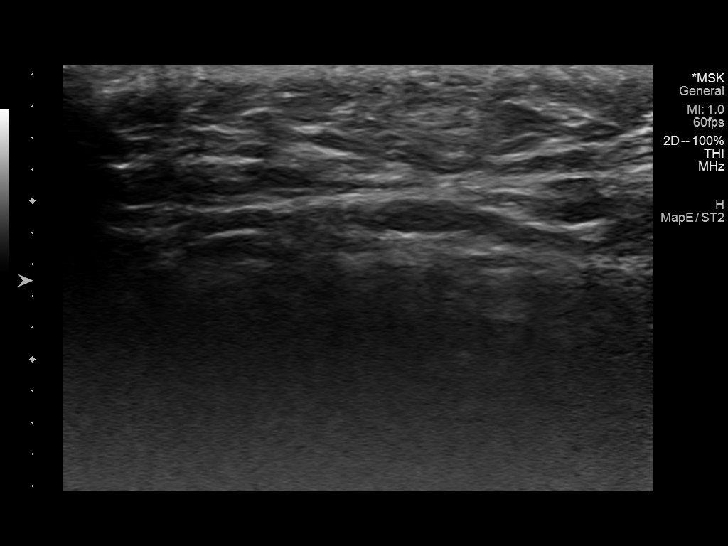
[im 7/14]
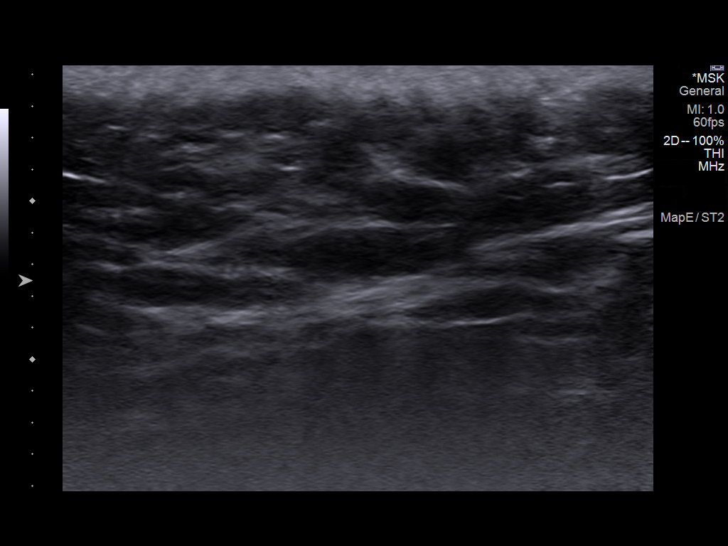
[im 8/14]
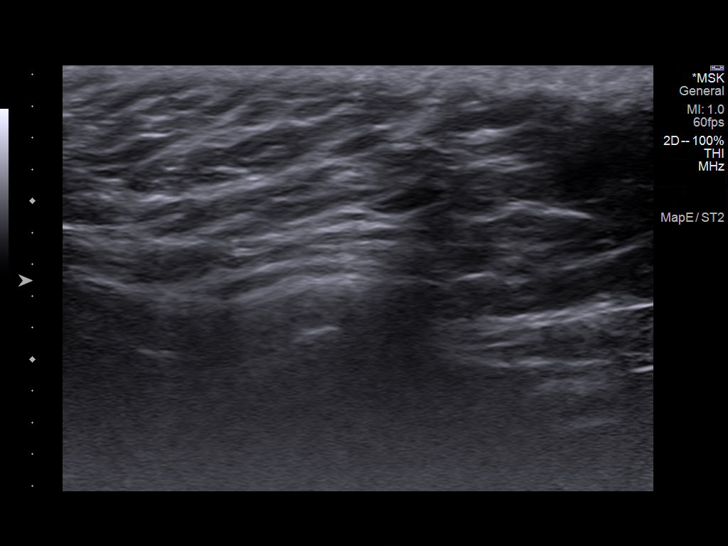
[im 9/14]
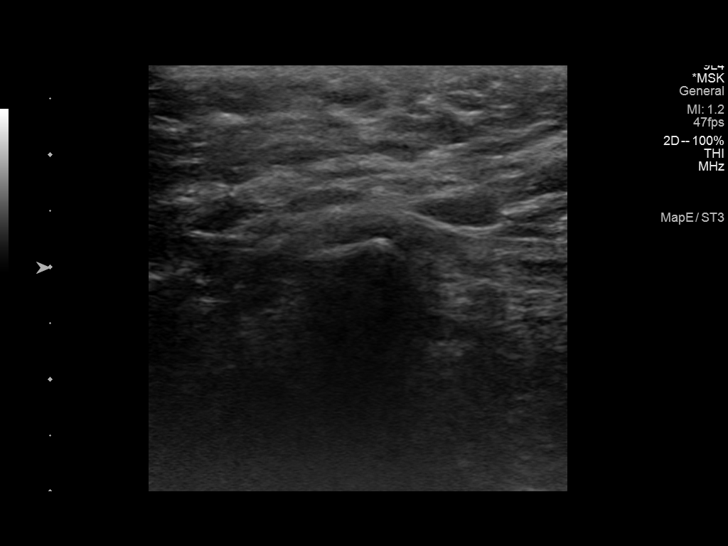
[im 10/14]
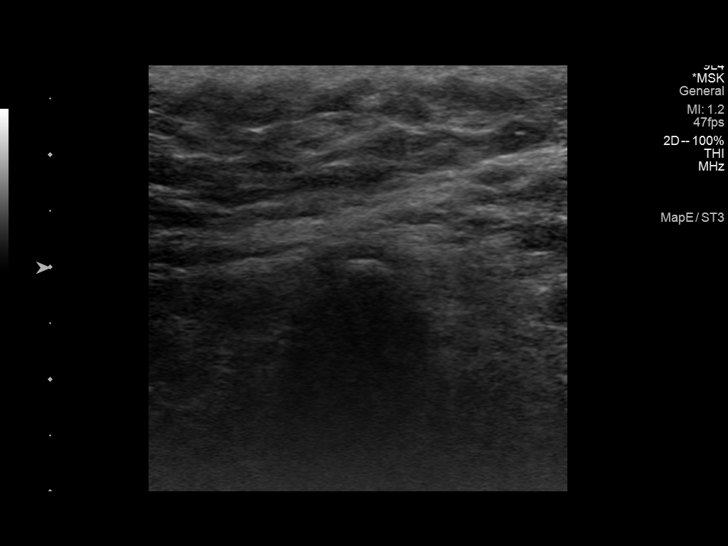
[im 11/14]
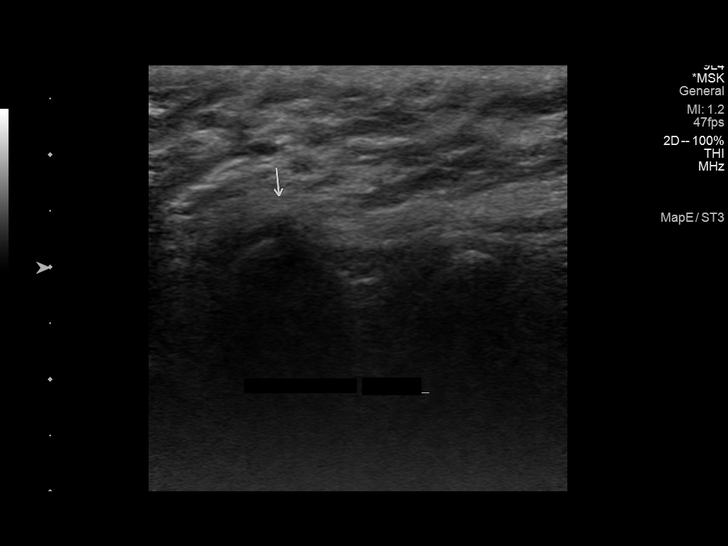
[im 12/14]
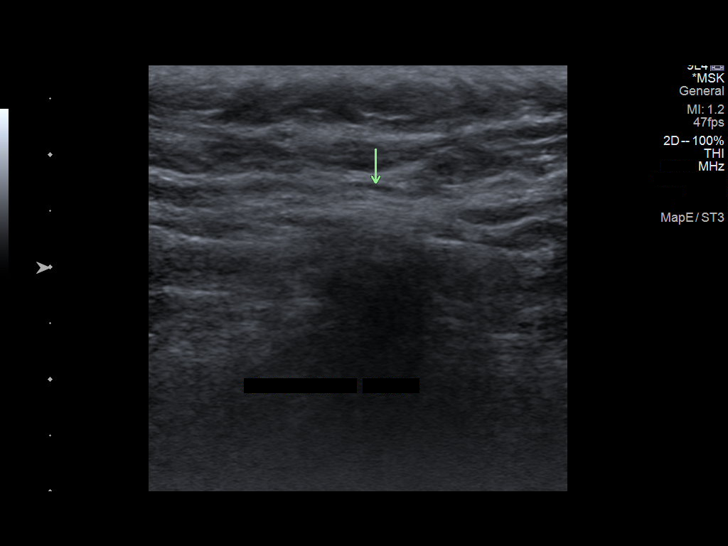
[im 13/14]
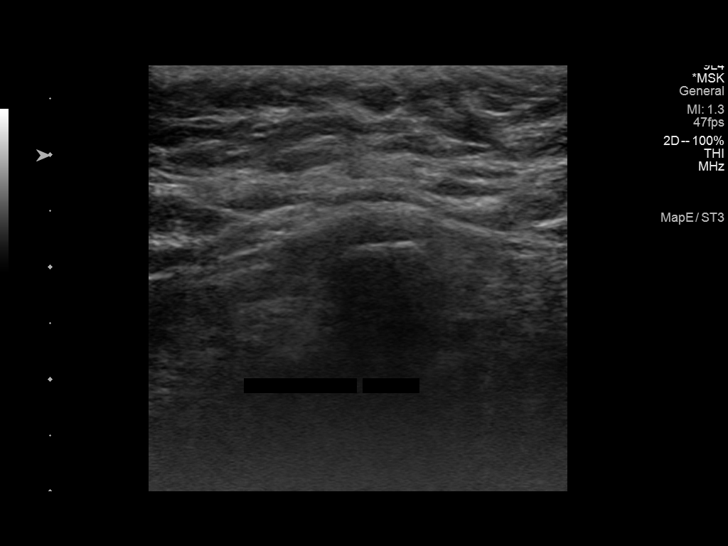
[im 14/14]
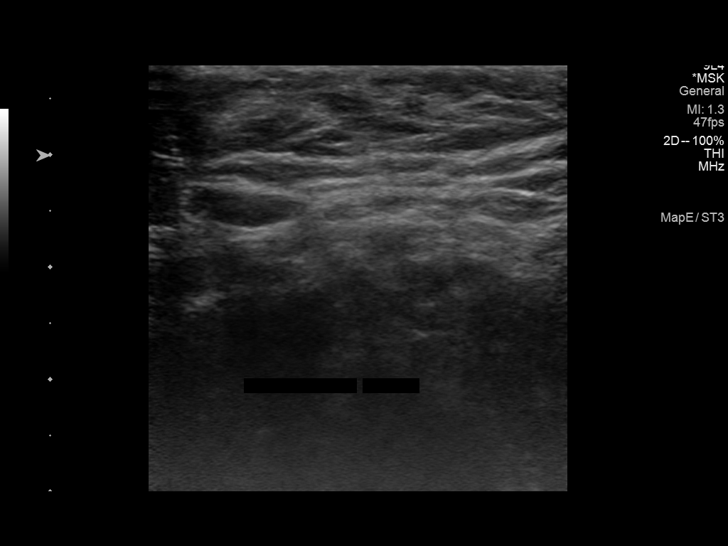

[14 of 14 positions shown; findings below may reference images not displayed]

FINDINGS: The area in question corresponds to the spinous process. No soft
tissue lesion is present.
IMPRESSION: 1. Prominent spinous process.  No associated soft tissue lesion.

## 2023-04-15 NOTE — Therapy (Addendum)
 OUTPATIENT PHYSICAL THERAPY FEMALE PELVIC TREATMENT   Patient Name: Lori Blackwell MRN: [id number] DOB:1990/09/14, 32 y.o., female Today's Date: 04/15/2023  END OF SESSION:  PT End of Session - 04/15/23 0848     Visit Number 4    Date for PT Re-Evaluation 05/26/23    Authorization Type BCBS    PT Start Time 0845    PT Stop Time 0926    PT Time Calculation (min) 41 min    Activity Tolerance Patient tolerated treatment well    Behavior During Therapy WFL for tasks assessed/performed             Past Medical History:  Diagnosis Date   Allergy    Asthma    SEASONAL    Bronchitis    Chicken pox    Chlamydia    from prenatal   Complication of anesthesia    HARD TIME WAKING UP   Dysrhythmia    H/O IN COLLEGE-HAD EKG WHICH PT STATES WAS NORMAL-NO PROBLEMS SINCE   Heart murmur    BORN WITH HEART MURMUR-OUTGREW-ASYMPTOMATIC   HPV (human papilloma virus) infection    from prenatal   Kidney stone    PONV (postoperative nausea and vomiting)    Past Surgical History:  Procedure Laterality Date   APPENDECTOMY  2001   BREAST BIOPSY  2020   fibroabnormal   CESAREAN SECTION N/A 04/15/2021   Procedure: CESAREAN SECTION;  Surgeon: Marlow Baars, MD;  Location: [facility];  Service: Obstetrics;  Laterality: N/A;   CESAREAN SECTION N/A 10/08/2022   Procedure: CESAREAN SECTION;  Surgeon: Marlow Baars, MD;  Location: [facility];  Service: Obstetrics;  Laterality: N/A;   LEEP     NASAL SINUS SURGERY N/A 11/13/2015   Procedure: ENDOSCOPIC SINUS SURGERY;  Surgeon: Bud Face, MD;  Location: [facility];  Service: ENT;  Laterality: N/A;   SEPTOPLASTY WITH ETHMOIDECTOMY, AND MAXILLARY ANTROSTOMY Bilateral 11/13/2015   Procedure: SEPTOPLASTY WITH ETHMOIDECTOMY, AND MAXILLARY ANTROSTOMY;  Surgeon: Bud Face, MD;  Location: [facility];  Service: ENT;  Laterality: Bilateral;  bilateral maxillary antrostomy, left anterior ethmoidectomy, left frontal sinusotomy   SHOULDER SURGERY Right  2008   2011   TURBINATE REDUCTION Bilateral 11/13/2015   Procedure: TURBINATE REDUCTION;  Surgeon: Bud Face, MD;  Location: [facility];  Service: ENT;  Laterality: Bilateral;   WISDOM TOOTH EXTRACTION     Patient Active Problem List   Diagnosis Date Noted   History of cesarean delivery, antepartum 10/08/2022   History of cesarean delivery 10/08/2022   Full-term premature rupture of membranes (PROM) with unknown onset of labor 04/15/2021   Irritable bowel syndrome with both constipation and diarrhea 01/19/2019   Acute upper respiratory infection 12/16/2015   ASCUS favoring benign 12/05/2015   Recurrent sinusitis 09/16/2015    PCP: Abbe Amsterdam, MD  REFERRING PROVIDER:   Carrington Clamp, MD    REFERRING DIAG: K62.89 (ICD-10-CM) - Other specified diseases of anus and rectum   THERAPY DIAG:  Other muscle spasm  Muscle weakness (generalized)  Abnormal posture  Unspecified lack of coordination  Rationale for Evaluation and Treatment: Rehabilitation  ONSET DATE: 10/08/22  SUBJECTIVE:  SUBJECTIVE STATEMENT: Has had about a week and half without any pain and very pleased with this. Did have about a week of pain after internal last session but has not have any since then. Has been running now about 3 miles mild pubic pain but resolved.   Fluid intake: Yes: 90 oz    PAIN:  Are you having pain? No NPRS scale: 3-7/10 Pain location: Anal  Pain type: pressure and sharp shooting Pain description: intermittent   Aggravating factors: sitting, with gas, when voiding Relieving factors: standing  PRECAUTIONS: None  RED FLAGS: None   WEIGHT BEARING RESTRICTIONS: No  FALLS:  Has patient fallen in last 6 months? No  LIVING ENVIRONMENT: Lives with: lives with their spouse and daughter,  infant Lives in: House/apartment   OCCUPATION: Education officer, environmental for Biomedical engineer- desk  PLOF: Independent  PATIENT GOALS: get rid of the pain  PERTINENT HISTORY:  Recent postpartum c-section, c-section x 2 Sexual abuse: No  BOWEL MOVEMENT: Pain with bowel movement: Yes Type of bowel movement:Type (Bristol Stool Scale) 1-7, Frequency 3/day, and Strain No not normally Fully empty rectum: Yes:   Leakage: No Pads: No Fiber supplement: No  URINATION: No issues  INTERCOURSE: Pain with intercourse: After Intercourse (normal after having baby having some burning/dryness) doesn't usually happen   PREGNANCY:  C-section deliveries 2 Currently pregnant No  PROLAPSE: None   OBJECTIVE:   DIAGNOSTIC FINDINGS:    PATIENT SURVEYS:     COGNITION: Overall cognitive status: Within functional limits for tasks assessed     SENSATION: Light touch: Appears intact Proprioception: Appears intact  MUSCLE LENGTH: Hamstrings: Right 60 deg; Left 70 deg Thomas test:normal  LUMBAR SPECIAL TESTS:  Active straight leg raise improved with compression and worsened with back support  FUNCTIONAL TESTS:  Single leg stand - normal  GAIT:  Comments: WFL   POSTURE: No Significant postural limitations  PELVIC ALIGNMENT:  LUMBARAROM/PROM:  A/PROM A/PROM  eval  Flexion 75%  Extension   Right lateral flexion   Left lateral flexion   Right rotation   Left rotation    (Blank rows = not tested)  LOWER EXTREMITY ROM:  Passive ROM Right eval Left eval  Hip flexion    Hip extension    Hip abduction    Hip adduction    Hip internal rotation    Hip external rotation    Knee flexion 80% 80%  Knee extension    Ankle dorsiflexion    Ankle plantarflexion    Ankle inversion    Ankle eversion     (Blank rows = not tested)  LOWER EXTREMITY MMT:  MMT Right eval Left eval  Hip flexion 5 5  Hip extension 4/5 5  Hip abduction 4/5 5  Hip adduction 5 5  Hip  internal rotation 5 5  Hip external rotation 5 5  Knee flexion    Knee extension    Ankle dorsiflexion    Ankle plantarflexion    Ankle inversion    Ankle eversion     PALPATION:   General  TTP coccyx, restricted scar at pubis still pink in color but well healed                External Perineal Exam normal                              Internal Pelvic Floor very TTP bil coccygeus and coccyx bone and ligament  Patient confirms  identification and approves PT to assess internal pelvic floor and treatment No  PELVIC MMT:   MMT eval  Vaginal   Internal Anal Sphincter 4/5  External Anal Sphincter   Puborectalis   Diastasis Recti One finger width and knuckle deep  (Blank rows = not tested)        TONE: Normal to high due to pain  PROLAPSE: Not noted  TODAY'S TREATMENT:                                                                                                                              DATE: 04/15/23  Windshield wipes x5 each Open books x10 each way Quad hip shift stretch 2x30s each Quad hip shift x10 each Single leg bridges x10 each - cues for core activation Lateral lunge + unilateral 10# OHP x15 Dead lifts 20# x15 Seated hip shift Standing cross body crunches x15 each 10# Marching palloffs #5 x10 each   PATIENT EDUCATION:  Education details: Access Code: [email or web address] Person educated: Patient Education method: Explanation, Demonstration, Tactile cues, Verbal cues, and Handouts Education comprehension: verbalized understanding and returned demonstration  HOME EXERCISE PROGRAM: Access Code: [email or web address] URL: [email or web address] Date: 03/03/2023 Prepared by: Dwana Curd  Exercises - Cat Cow  - 1 x daily - 7 x weekly - 3 sets - 10 reps - Weight shift quadruped  - 1 x daily - 7 x weekly - 3 sets - 10 reps - Quadruped Thoracic Rotation Full Range with Hand on Neck  - 1 x daily - 7 x weekly - 3 sets - 10 reps  Patient Education - Trigger  Point Dry Needling  ASSESSMENT:  CLINICAL IMPRESSION: Patient presents for treatment. Pt having improvement in symptoms overall and pleased with progress. Session focused on hip and pelvic stability single leg standing for return to running. Pt will benefit from skilled PT to address all impairments mentioned above for improved function and reduced pain.  OBJECTIVE IMPAIRMENTS: decreased coordination, decreased endurance, decreased ROM, decreased strength, increased fascial restrictions, increased muscle spasms, impaired flexibility, impaired tone, and pain.   ACTIVITY LIMITATIONS: carrying, lifting, sitting, and toileting  PARTICIPATION LIMITATIONS: driving and community activity  PERSONAL FACTORS: 1-2 comorbidities: 2 c-sections  are also affecting patient's functional outcome.   REHAB POTENTIAL: Excellent  CLINICAL DECISION MAKING: Stable/uncomplicated  EVALUATION COMPLEXITY: Low   GOALS: Goals reviewed with patient? Yes  SHORT TERM GOALS: Target date: 03/31/23  Ind with initial HEP Baseline: Goal status: INITIAL   LONG TERM GOALS: Target date: 05/26/23  Pt will be independent with advanced HEP to maintain improvements made throughout therapy  Baseline:  Goal status: INITIAL  2.  Pt will report 80% reduction of pain due to improvements in posture, strength, and muscle length  Baseline:  Goal status: INITIAL  3.  Pt will be able to return to normal exercises routine without pain Baseline:  Goal status: INITIAL  4.  Pt will not have pain before, during, or after  voiding or having a bowel movement. Baseline:  Goal status: INITIAL  PLAN:  PT FREQUENCY: 1x/week  PT DURATION: 12 weeks  PLANNED INTERVENTIONS: Therapeutic exercises, Therapeutic activity, Neuromuscular re-education, Balance training, Gait training, Patient/Family education, Self Care, Joint mobilization, Dry Needling, Electrical stimulation, Spinal manipulation, Spinal mobilization, Cryotherapy, Moist  heat, Taping, Traction, Biofeedback, Manual therapy, and Re-evaluation  PLAN FOR NEXT SESSION: dry needling or deep tissue to piriformis bil, breathing and bulging posterior pelvic floor and hamstring stretches, transversus abdominus activation   Otelia Sergeant, PT, DPT 09/06/249:31 AM   PHYSICAL THERAPY DISCHARGE SUMMARY  Visits from Start of Care: 4  Current functional level related to goals / functional outcomes: Unable to reassess however pt cancelled remaining appointments reporting feeling better   Remaining deficits: Unable to formally reassess   Education / Equipment: HEP   Patient agrees to discharge. Patient goals were partially met. Patient is being discharged due to being pleased with the current functional level.  Otelia Sergeant, PT, DPT 10/17/2509:26 AM

## 2023-04-29 DIAGNOSIS — Z3042 Encounter for surveillance of injectable contraceptive: Secondary | ICD-10-CM | POA: Diagnosis not present

## 2023-05-05 ENCOUNTER — Ambulatory Visit: Payer: BC Managed Care – PPO | Admitting: Plastic Surgery

## 2023-05-05 ENCOUNTER — Encounter: Payer: BC Managed Care – PPO | Admitting: Physical Therapy

## 2023-05-05 ENCOUNTER — Encounter: Payer: Self-pay | Admitting: Plastic Surgery

## 2023-05-05 VITALS — BP 105/70 | HR 67 | Ht 67.0 in | Wt 178.4 lb

## 2023-05-05 DIAGNOSIS — E65 Localized adiposity: Secondary | ICD-10-CM

## 2023-05-05 NOTE — Progress Notes (Signed)
Referring Provider Lori Saint, MD [address] [place],  [place] [address]   CC:  Chief Complaint  Patient presents with   Advice Only      Lori Blackwell is an 32 y.o. female.  HPI: Lori Blackwell is a 32 year old female who is referred for evaluation of a fullness on the back at the upper thoracic spine.  Patient states this has been there for quite some time.  Indeed she has been evaluated by general surgery in June 2023 and per her reports has also been seen by dermatologist.  She states that the area has become more prominent since she became pregnant with her first chil though she also noted a 60 pound weight gain.  She has since had a second child and has lost approximately 40 pounds and is unsure if there is been any significant change in the mass with her weight loss.  Allergies  Allergen Reactions   Shellfish Allergy Anaphylaxis   Sulfa Antibiotics Rash    Outpatient Encounter Medications as of 05/05/2023  Medication Sig   EPINEPHrine 0.3 mg/0.3 mL IJ SOAJ injection Inject 0.3 mg into the muscle as needed.   medroxyPROGESTERone (DEPO-PROVERA) 150 MG/ML injection Inject 150 mg into the muscle every 3 (three) months.   No facility-administered encounter medications on file as of 05/05/2023.     Past Medical History:  Diagnosis Date   Allergy    Asthma    SEASONAL    Bronchitis    Chicken pox    Chlamydia    from prenatal   Complication of anesthesia    HARD TIME WAKING UP   Dysrhythmia    H/O IN COLLEGE-HAD EKG WHICH PT STATES WAS NORMAL-NO PROBLEMS SINCE   Heart murmur    BORN WITH HEART MURMUR-OUTGREW-ASYMPTOMATIC   HPV (human papilloma virus) infection    from prenatal   Kidney stone    PONV (postoperative nausea and vomiting)     Past Surgical History:  Procedure Laterality Date   APPENDECTOMY  2001   BREAST BIOPSY  2020   fibroabnormal   CESAREAN SECTION N/A 04/15/2021   Procedure: CESAREAN SECTION;  Surgeon: Marlow Baars, MD;  [facility];  Service: Obstetrics;  Laterality: N/A;   CESAREAN SECTION N/A 10/08/2022   Procedure: CESAREAN SECTION;  Surgeon: Marlow Baars, MD;  Location: [facility];  Service: Obstetrics;  Laterality: N/A;   LEEP     NASAL SINUS SURGERY N/A 11/13/2015   Procedure: ENDOSCOPIC SINUS SURGERY;  Surgeon: Bud Face, MD;  Location: [facility];  Service: ENT;  Laterality: N/A;   SEPTOPLASTY WITH ETHMOIDECTOMY, AND MAXILLARY ANTROSTOMY Bilateral 11/13/2015   Procedure: SEPTOPLASTY WITH ETHMOIDECTOMY, AND MAXILLARY ANTROSTOMY;  Surgeon: Bud Face, MD;  Location: [facility];  Service: ENT;  Laterality: Bilateral;  bilateral maxillary antrostomy, left anterior ethmoidectomy, left frontal sinusotomy   SHOULDER SURGERY Right 2008   2011   TURBINATE REDUCTION Bilateral 11/13/2015   Procedure: TURBINATE REDUCTION;  Surgeon: Bud Face, MD;  Location: [facility];  Service: ENT;  Laterality: Bilateral;   WISDOM TOOTH EXTRACTION      Family History  Problem Relation Age of Onset   Hyperlipidemia Mother    Healthy Father    Cancer Maternal Aunt        breast cancer   Cancer Maternal Grandmother        breast cancer   Colon cancer Neg Hx    Esophageal cancer Neg Hx     Social History  Social History Narrative   Lori Blackwell grew up in [place]. 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 She is working as a Midwife at [employer]. She lives in [place].      Hobbies: Running, paint with acrylics   Exercise: Regularly attends [employer] and does weights, interval training, cardio   Diet: Follows clean eating and allows herself 3 cheat meals weekly.     Review of Systems General: Denies fevers, chills, weight loss CV: Denies chest pain, shortness of breath, palpitations Skin: Fullness on the lower neck upper back  Physical Exam    05/05/2023    9:37 AM 01/21/2023    9:38 AM 12/22/2022    2:41 PM  Vitals with BMI  Height 5\' 7"      Weight 178 lbs 6 oz 192 lbs 10 oz 195 lbs  BMI 27.93    Systolic 105 116 161  Diastolic 70 78 72  Pulse 67 85 85    General:  No acute distress,  Alert and oriented, Non-Toxic, Normal speech and affect Integument: On palpation of the area of concern there is a very indistinct fullness however most of the prominence seems to be due to a spinous process.  I do not feel any discrete mass. Mammogram: Not applicable due to age Assessment/Plan Fullness, lower neck upper back: No discrete masses palpated.  On review of the chart the patient did have an ultrasound in June of last year which showed no mass and a prominent spinous process.  I reviewed these results with her.  She states that during her reading she felt that a lipoma was described as a much more discrete mass and she was concerned that there was no lipoma present I agreed with her.  I do not palpate any mass that I can excise.  She is continuing to lose weight and this may improve the fullness in the area.  Additionally better posture may also make the spinous process less prominent.  I have offered to follow-up with her in 6 months at which time if she feels there is still a prominent area we can move forward with an MRI to definitively rule out any type of pathologic process in this area.  She is happy with this plan and will follow-up with me as needed.  Santiago Glad 05/05/2023, 10:13 AM

## 2023-05-12 ENCOUNTER — Ambulatory Visit: Payer: BC Managed Care – PPO | Attending: Obstetrics and Gynecology | Admitting: Physical Therapy

## 2023-05-19 ENCOUNTER — Ambulatory Visit: Payer: BC Managed Care – PPO | Admitting: Physical Therapy

## 2023-05-26 ENCOUNTER — Encounter: Payer: BC Managed Care – PPO | Admitting: Physical Therapy

## 2023-05-26 ENCOUNTER — Ambulatory Visit: Payer: BC Managed Care – PPO | Admitting: Physical Therapy

## 2023-06-02 ENCOUNTER — Encounter: Payer: BC Managed Care – PPO | Admitting: Physical Therapy

## 2023-06-08 ENCOUNTER — Encounter: Payer: BC Managed Care – PPO | Admitting: Physical Therapy

## 2023-06-16 DIAGNOSIS — D225 Melanocytic nevi of trunk: Secondary | ICD-10-CM | POA: Diagnosis not present

## 2023-06-16 DIAGNOSIS — L0101 Non-bullous impetigo: Secondary | ICD-10-CM | POA: Diagnosis not present

## 2023-06-16 DIAGNOSIS — Z08 Encounter for follow-up examination after completed treatment for malignant neoplasm: Secondary | ICD-10-CM | POA: Diagnosis not present

## 2023-06-16 DIAGNOSIS — L814 Other melanin hyperpigmentation: Secondary | ICD-10-CM | POA: Diagnosis not present

## 2023-06-27 ENCOUNTER — Encounter: Payer: Self-pay | Admitting: Family Medicine

## 2023-06-27 ENCOUNTER — Ambulatory Visit (INDEPENDENT_AMBULATORY_CARE_PROVIDER_SITE_OTHER): Payer: BC Managed Care – PPO | Admitting: Family Medicine

## 2023-06-27 VITALS — BP 100/72 | HR 79 | Temp 98.2°F | Ht 67.0 in | Wt 179.6 lb

## 2023-06-27 DIAGNOSIS — H1031 Unspecified acute conjunctivitis, right eye: Secondary | ICD-10-CM | POA: Diagnosis not present

## 2023-06-27 DIAGNOSIS — R41840 Attention and concentration deficit: Secondary | ICD-10-CM | POA: Diagnosis not present

## 2023-06-27 MED ORDER — POLYMYXIN B-TRIMETHOPRIM 10000-0.1 UNIT/ML-% OP SOLN: 1.0000 [drp] | OPHTHALMIC | 0 refills | Status: DC

## 2023-06-27 NOTE — Progress Notes (Signed)
Established Patient Office Visit   Subjective  Patient ID: Lori Blackwell, female    DOB: September 03, 1990  Age: 32 y.o. MRN: [id number]  Chief Complaint  Patient presents with   Conjunctivitis    Started 2 days ago, right eye, itchy,     Patient is a 32 year old female seen for acute concern.  Patient endorses irritation, redness, drainage, soreness of right eye x 3 days.  Patient also notes eye being crusted shut in morning.  Denies fever, chills, URI symptoms.  Patient does have young children, and 60-month-old and a 55-year-old who are without symptoms.  Patient surprised by positive answers on GAD-7, score 7.  Has noted wearing more since having second child.  Patient also wonders if she has ADHD.  Considered ADHD testing however was unable to get a response from [facility] when contacted x 2.  States is always been able to do well in school but feels like she had to work harder than others.  Notes starting task but not completing them, forgetting her thought midsentence, or forgetting why she walked in the room.  Conjunctivitis     Patient Active Problem List   Diagnosis Date Noted   History of cesarean delivery, antepartum 10/08/2022   History of cesarean delivery 10/08/2022   Full-term premature rupture of membranes (PROM) with unknown onset of labor 04/15/2021   Irritable bowel syndrome with both constipation and diarrhea 01/19/2019   Acute upper respiratory infection 12/16/2015   ASCUS favoring benign 12/05/2015   Recurrent sinusitis 09/16/2015   Past Medical History:  Diagnosis Date   Allergy    Asthma    SEASONAL    Bronchitis    Chicken pox    Chlamydia    from prenatal   Complication of anesthesia    HARD TIME WAKING UP   Dysrhythmia    H/O IN COLLEGE-HAD EKG WHICH PT STATES WAS NORMAL-NO PROBLEMS SINCE   Heart murmur    BORN WITH HEART MURMUR-OUTGREW-ASYMPTOMATIC   HPV (human papilloma virus) infection    from prenatal   Kidney stone    PONV  (postoperative nausea and vomiting)    Past Surgical History:  Procedure Laterality Date   APPENDECTOMY  2001   BREAST BIOPSY  2020   fibroabnormal   CESAREAN SECTION N/A 04/15/2021   Procedure: CESAREAN SECTION;  Surgeon: Marlow Baars, MD;  [facility];  Service: Obstetrics;  Laterality: N/A;   CESAREAN SECTION N/A 10/08/2022   Procedure: CESAREAN SECTION;  Surgeon: Marlow Baars, MD;  [facility];  Service: Obstetrics;  Laterality: N/A;   LEEP     NASAL SINUS SURGERY N/A 11/13/2015   Procedure: ENDOSCOPIC SINUS SURGERY;  Surgeon: Bud Face, MD;  Location: [facility];  Service: ENT;  Laterality: N/A;   SEPTOPLASTY WITH ETHMOIDECTOMY, AND MAXILLARY ANTROSTOMY Bilateral 11/13/2015   Procedure: SEPTOPLASTY WITH ETHMOIDECTOMY, AND MAXILLARY ANTROSTOMY;  Surgeon: Bud Face, MD;  Location: [facility];  Service: ENT;  Laterality: Bilateral;  bilateral maxillary antrostomy, left anterior ethmoidectomy, left frontal sinusotomy   SHOULDER SURGERY Right 2008   2011   TURBINATE REDUCTION Bilateral 11/13/2015   Procedure: TURBINATE REDUCTION;  Surgeon: Bud Face, MD;  Location: [facility];  Service: ENT;  Laterality: Bilateral;   WISDOM TOOTH EXTRACTION     Social History   Tobacco Use   Smoking status: Never   Smokeless tobacco: Never  Vaping Use   Vaping status: Never Used  Substance Use Topics   Alcohol use: Not Currently  Comment: 1-3 drinks weekly   Drug use: No   Family History  Problem Relation Age of Onset   Hyperlipidemia Mother    Healthy Father    Cancer Maternal Aunt        breast cancer   Cancer Maternal Grandmother        breast cancer   Colon cancer Neg Hx    Esophageal cancer Neg Hx    Allergies  Allergen Reactions   Shellfish Allergy Anaphylaxis   Sulfa Antibiotics Rash      ROS Negative unless stated above    Objective:     BP 100/72 (BP Location: Right Arm, Patient Position: Sitting, Cuff Size: Normal)   Pulse 79   Temp  98.2 F (36.8 C) (Oral)   Ht 5\' 7"  (1.702 m)   Wt 179 lb 9.6 oz (81.5 kg)   LMP  (LMP Unknown)   SpO2 97%   Breastfeeding No Comment: patient stopped brestfeeding a month ago  BMI 28.13 kg/m  BP Readings from Last 3 Encounters:  06/27/23 100/72  05/05/23 105/70  01/21/23 116/78   Wt Readings from Last 3 Encounters:  06/27/23 179 lb 9.6 oz (81.5 kg)  05/05/23 178 lb 6.4 oz (80.9 kg)  01/21/23 192 lb 9.6 oz (87.4 kg)      Physical Exam Constitutional:      General: She is not in acute distress.    Appearance: Normal appearance.  HENT:     Head: Normocephalic and atraumatic.     Nose: Nose normal.     Mouth/Throat:     Mouth: Mucous membranes are moist.  Eyes:     Extraocular Movements: Extraocular movements intact.     Conjunctiva/sclera:     Right eye: Right conjunctiva is injected.     Left eye: Left conjunctiva is not injected.     Comments: Mild edema of right upper eyelid.  Cardiovascular:     Rate and Rhythm: Normal rate and regular rhythm.     Heart sounds: Normal heart sounds. No murmur heard.    No gallop.  Pulmonary:     Effort: Pulmonary effort is normal. No respiratory distress.     Breath sounds: Normal breath sounds. No wheezing, rhonchi or rales.  Skin:    General: Skin is warm and dry.  Neurological:     Mental Status: She is alert and oriented to person, place, and time. Mental status is at baseline.  Psychiatric:        Mood and Affect: Mood normal.        Behavior: Behavior normal.        Thought Content: Thought content normal.    GAD-7 score 7    06/27/2023    3:13 PM  GAD 7 : Generalized Anxiety Score  Nervous, Anxious, on Edge 1  Control/stop worrying 1  Worry too much - different things 1  Trouble relaxing 1  Restless 1  Easily annoyed or irritable 1  Afraid - awful might happen 1  Total GAD 7 Score 7    No results found for any visits on 06/27/23.    Assessment & Plan:  Acute bacterial conjunctivitis of right eye -      Polymyxin B-Trimethoprim; Place 1 drop into the right eye every 4 (four) hours.  Dispense: 10 mL; Refill: 0  Impaired concentration  New problem.  Acute conjunctivitis right eye.  Start Polytrim eyedrops.  Hand hygiene.    Pt to look into other psychology offices that offer ADHD testing.  Call to set up appt.  Return if symptoms worsen or fail to improve.   Deeann Saint, MD

## 2023-06-29 ENCOUNTER — Ambulatory Visit: Payer: BC Managed Care – PPO | Admitting: Dermatology

## 2023-07-08 ENCOUNTER — Encounter: Payer: Self-pay | Admitting: Family Medicine

## 2023-07-11 ENCOUNTER — Other Ambulatory Visit: Payer: Self-pay | Admitting: Family Medicine

## 2023-07-11 DIAGNOSIS — R051 Acute cough: Secondary | ICD-10-CM

## 2023-07-11 DIAGNOSIS — Z2089 Contact with and (suspected) exposure to other communicable diseases: Secondary | ICD-10-CM

## 2023-07-11 MED ORDER — AMOXICILLIN-POT CLAVULANATE 500-125 MG PO TABS: 1.0000 | ORAL_TABLET | Freq: Two times a day (BID) | ORAL | 0 refills | Status: AC

## 2023-07-21 DIAGNOSIS — R52 Pain, unspecified: Secondary | ICD-10-CM | POA: Diagnosis not present

## 2023-07-21 DIAGNOSIS — M2240 Chondromalacia patellae, unspecified knee: Secondary | ICD-10-CM | POA: Diagnosis not present

## 2023-07-25 DIAGNOSIS — Z01419 Encounter for gynecological examination (general) (routine) without abnormal findings: Secondary | ICD-10-CM | POA: Diagnosis not present

## 2023-08-15 ENCOUNTER — Encounter: Payer: Self-pay | Admitting: Family Medicine

## 2023-08-15 NOTE — Telephone Encounter (Signed)
 Called and spoke with patient got her sch with a provider in the office 08/16/23

## 2023-08-16 ENCOUNTER — Encounter: Payer: Self-pay | Admitting: Family Medicine

## 2023-08-16 ENCOUNTER — Telehealth (INDEPENDENT_AMBULATORY_CARE_PROVIDER_SITE_OTHER): Payer: BC Managed Care – PPO | Admitting: Family Medicine

## 2023-08-16 DIAGNOSIS — J0191 Acute recurrent sinusitis, unspecified: Secondary | ICD-10-CM | POA: Diagnosis not present

## 2023-08-16 MED ORDER — AMOXICILLIN-POT CLAVULANATE 875-125 MG PO TABS: 1.0000 | ORAL_TABLET | Freq: Two times a day (BID) | ORAL | 0 refills | Status: DC

## 2023-08-16 NOTE — Progress Notes (Signed)
 Patient ID: Lori Blackwell, female   DOB: 1990/12/17, 33 y.o.   MRN: [id number]   Virtual Visit via Telephone Note  I connected with Lori Blackwell on 08/16/23 at  2:00 PM EST by telephone and verified that I am speaking with the correct person using two identifiers.   I discussed the limitations, risks, security and privacy concerns of performing an evaluation and management service by telephone and the availability of in person appointments. I also discussed with the patient that there may be a patient responsible charge related to this service. The patient expressed understanding and agreed to proceed.  Location patient: home Location provider: work or home office Participants present for the call: patient, provider Patient did not have a visit in the prior 7 days to address this/these issue(s).   History of Present Illness:  We attempted to connect for virtual visit but after several attempts would not go through.  Lori Blackwell eats past history of recurrent sinusitis.  She has had previous sinus surgery 2017.  Onset last Thursday of laryngitis symptoms all sore throat and progressive nasal congestion.  She has had some upper teeth pain and facial pain.  Some purulent like secretions.  Cough has been severe at night and interfering with sleep.  No fever.  Increased malaise.  Intermittent headaches.  Allergy to sulfa.  Has taken Augmentin  in the past which seems to work fairly well for her.  Past Medical History:  Diagnosis Date   Allergy    Asthma    SEASONAL    Bronchitis    Chicken pox    Chlamydia    from prenatal   Complication of anesthesia    HARD TIME WAKING UP   Dysrhythmia    H/O IN COLLEGE-HAD EKG WHICH PT STATES WAS NORMAL-NO PROBLEMS SINCE   Heart murmur    BORN WITH HEART MURMUR-OUTGREW-ASYMPTOMATIC   HPV (human papilloma virus) infection    from prenatal   Kidney stone    PONV (postoperative nausea and vomiting)    Past Surgical History:  Procedure Laterality Date    APPENDECTOMY  2001   BREAST BIOPSY  2020   fibroabnormal   CESAREAN SECTION N/A 04/15/2021   Procedure: CESAREAN SECTION;  Surgeon: Gretta Gums, MD;  [facility];  Service: Obstetrics;  Laterality: N/A;   CESAREAN SECTION N/A 10/08/2022   Procedure: CESAREAN SECTION;  Surgeon: Gretta Gums, MD;  [facility];  Service: Obstetrics;  Laterality: N/A;   LEEP     NASAL SINUS SURGERY N/A 11/13/2015   Procedure: ENDOSCOPIC SINUS SURGERY;  Surgeon: Carolee Hunter, MD;  Location: [facility];  Service: ENT;  Laterality: N/A;   SEPTOPLASTY WITH ETHMOIDECTOMY, AND MAXILLARY ANTROSTOMY Bilateral 11/13/2015   Procedure: SEPTOPLASTY WITH ETHMOIDECTOMY, AND MAXILLARY ANTROSTOMY;  Surgeon: Carolee Hunter, MD;  Location: [facility];  Service: ENT;  Laterality: Bilateral;  bilateral maxillary antrostomy, left anterior ethmoidectomy, left frontal sinusotomy   SHOULDER SURGERY Right 2008   2011   TURBINATE REDUCTION Bilateral 11/13/2015   Procedure: TURBINATE REDUCTION;  Surgeon: Carolee Hunter, MD;  Location: [facility];  Service: ENT;  Laterality: Bilateral;   WISDOM TOOTH EXTRACTION      reports that she has never smoked. She has never used smokeless tobacco. She reports that she does not currently use alcohol. She reports that she does not use drugs. family history includes Cancer in her maternal aunt and maternal grandmother; Healthy in her father; Hyperlipidemia in her mother. Allergies  Allergen Reactions   Shellfish Allergy Anaphylaxis  Sulfa Antibiotics Rash      Observations/Objective: Patient sounds cheerful and well on the phone. I do not appreciate any SOB. Speech and thought processing are grossly intact. Patient reported vitals:  Assessment and Plan:   Follow Up Instructions:  Acute sinusitis.  Longstanding history of recurrent sinusitis with prior history of sinus surgery -Start Augmentin  875 mg twice daily for 10 days -Continue saline nasal irrigation with  distilled water  -She will try over-the-counter cough medication and let us  know if that is not adequate for nighttime cough   [id number] [id number] [id number] 11-20 [id number] 21-30 I did not refer this patient for an OV in the next 24 hours for this/these issue(s).  I discussed the assessment and treatment plan with the patient. The patient was provided an opportunity to ask questions and all were answered. The patient agreed with the plan and demonstrated an understanding of the instructions.   The patient was advised to call back or seek an in-person evaluation if the symptoms worsen or if the condition fails to improve as anticipated.  I provided 14 minutes of non-face-to-face time during this encounter.   Wolm Scarlet, MD

## 2023-09-04 ENCOUNTER — Ambulatory Visit
Admission: EM | Admit: 2023-09-04 | Discharge: 2023-09-04 | Disposition: A | Discharge status: Home / Self Care | Payer: BC Managed Care – PPO | Attending: Family Medicine | Admitting: Family Medicine

## 2023-09-04 ENCOUNTER — Other Ambulatory Visit: Payer: Self-pay

## 2023-09-04 DIAGNOSIS — J0101 Acute recurrent maxillary sinusitis: Secondary | ICD-10-CM

## 2023-09-04 MED ORDER — DOXYCYCLINE HYCLATE 100 MG PO CAPS: 100.0000 mg | ORAL_CAPSULE | Freq: Two times a day (BID) | ORAL | 0 refills | Status: AC

## 2023-09-04 NOTE — ED Provider Notes (Addendum)
[facility]    CSN: [id number] Arrival date & time: 09/04/23  1301      History   Chief Complaint Chief Complaint  Patient presents with   Cough    HPI Lori Blackwell is a 33 y.o. female  presents for evaluation of URI symptoms for 7 days. Patient reports associated symptoms of sinus pressure/pain with headache, aching teeth. Denies N/V/D, fevers, sore throat, cough, body aches, shortness of breath. Patient does not have a hx of asthma. Patient is not an active smoker.  Did a virtual visit on January 7 for similar symptoms and was prescribed Augmentin.  States she stopped it after 5 days as it did not help her at all.  She does report history of recurrent sinus infections and has seen ENT in the past with sinus surgery.  She states her current symptoms are much worse than they were earlier this month and she knows this is a sinus infection.  Pt has taken Flonase and nasal rinses OTC for symptoms.  Patient denies pregnancy or breast-feeding.  Pt has no other concerns at this time.    Cough Associated symptoms: headaches     Past Medical History:  Diagnosis Date   Allergy    Asthma    SEASONAL    Bronchitis    Chicken pox    Chlamydia    from prenatal   Complication of anesthesia    HARD TIME WAKING UP   Dysrhythmia    H/O IN COLLEGE-HAD EKG WHICH PT STATES WAS NORMAL-NO PROBLEMS SINCE   Heart murmur    BORN WITH HEART MURMUR-OUTGREW-ASYMPTOMATIC   HPV (human papilloma virus) infection    from prenatal   Kidney stone    PONV (postoperative nausea and vomiting)     Patient Active Problem List   Diagnosis Date Noted   History of cesarean delivery, antepartum 10/08/2022   History of cesarean delivery 10/08/2022   Full-term premature rupture of membranes (PROM) with unknown onset of labor 04/15/2021   Irritable bowel syndrome with both constipation and diarrhea 01/19/2019   Acute upper respiratory infection 12/16/2015   ASCUS favoring benign 12/05/2015    Recurrent sinusitis 09/16/2015    Past Surgical History:  Procedure Laterality Date   APPENDECTOMY  2001   BREAST BIOPSY  2020   fibroabnormal   CESAREAN SECTION N/A 04/15/2021   Procedure: CESAREAN SECTION;  Surgeon: Marlow Baars, MD;  Location: [facility];  Service: Obstetrics;  Laterality: N/A;   CESAREAN SECTION N/A 10/08/2022   Procedure: CESAREAN SECTION;  Surgeon: Marlow Baars, MD;  Location: [facility];  Service: Obstetrics;  Laterality: N/A;   LEEP     NASAL SINUS SURGERY N/A 11/13/2015   Procedure: ENDOSCOPIC SINUS SURGERY;  Surgeon: Bud Face, MD;  Location: [facility];  Service: ENT;  Laterality: N/A;   SEPTOPLASTY WITH ETHMOIDECTOMY, AND MAXILLARY ANTROSTOMY Bilateral 11/13/2015   Procedure: SEPTOPLASTY WITH ETHMOIDECTOMY, AND MAXILLARY ANTROSTOMY;  Surgeon: Bud Face, MD;  Location: [facility];  Service: ENT;  Laterality: Bilateral;  bilateral maxillary antrostomy, left anterior ethmoidectomy, left frontal sinusotomy   SHOULDER SURGERY Right 2008   2011   TURBINATE REDUCTION Bilateral 11/13/2015   Procedure: TURBINATE REDUCTION;  Surgeon: Bud Face, MD;  Location: [facility];  Service: ENT;  Laterality: Bilateral;   WISDOM TOOTH EXTRACTION      OB History     Gravida  2   Para  2   Term  2   Preterm      AB  Living  2      SAB      IAB      Ectopic      Multiple  0   Live Births  2            Home Medications    Prior to Admission medications   Medication Sig Start Date End Date Taking? Authorizing Provider  doxycycline (VIBRAMYCIN) 100 MG capsule Take 1 capsule (100 mg total) by mouth 2 (two) times daily for 7 days. 09/04/23 09/11/23 Yes Radford Pax, NP  EPINEPHrine 0.3 mg/0.3 mL IJ SOAJ injection Inject 0.3 mg into the muscle as needed. 01/21/23   Deeann Saint, MD  mupirocin ointment (BACTROBAN) 2 % SMARTSIG:1 Application Topical 2-3 Times Daily 06/16/23   [provider]    Family History Family History   Problem Relation Age of Onset   Hyperlipidemia Mother    Healthy Father    Cancer Maternal Aunt        breast cancer   Cancer Maternal Grandmother        breast cancer   Colon cancer Neg Hx    Esophageal cancer Neg Hx     Social History Social History   Tobacco Use   Smoking status: Never   Smokeless tobacco: Never  Vaping Use   Vaping status: Never Used  Substance Use Topics   Alcohol use: Not Currently    Comment: 1-3 drinks weekly   Drug use: No     Allergies   Shellfish allergy and Sulfa antibiotics   Review of Systems Review of Systems  HENT:  Positive for congestion, sinus pressure and sinus pain.   Neurological:  Positive for headaches.     Physical Exam Triage Vital Signs ED Triage Vitals  Encounter Vitals Group     BP 09/04/23 1328 115/76     Systolic BP Percentile --      Diastolic BP Percentile --      Pulse Rate 09/04/23 1328 70     Resp 09/04/23 1328 16     Temp 09/04/23 1328 98.6 F (37 C)     Temp Source 09/04/23 1328 Oral     SpO2 09/04/23 1328 97 %     Weight --      Height --      Head Circumference --      Peak Flow --      Pain Score 09/04/23 1330 6     Pain Loc --      Pain Education --      Exclude from Growth Chart --    No data found.  Updated Vital Signs BP 115/76 (BP Location: Right Arm)   Pulse 70   Temp 98.6 F (37 C) (Oral)   Resp 16   SpO2 97%   Visual Acuity Right Eye Distance:   Left Eye Distance:   Bilateral Distance:    Right Eye Near:   Left Eye Near:    Bilateral Near:     Physical Exam Vitals and nursing note reviewed.  Constitutional:      General: She is not in acute distress.    Appearance: She is well-developed. She is not ill-appearing.  HENT:     Head: Normocephalic and atraumatic.     Right Ear: Tympanic membrane and ear canal normal.     Left Ear: Tympanic membrane and ear canal normal.     Nose: Congestion present.     Right Turbinates: Swollen and pale.  Left Turbinates:  Swollen and pale.     Right Sinus: Maxillary sinus tenderness and frontal sinus tenderness present.     Left Sinus: Maxillary sinus tenderness and frontal sinus tenderness present.     Mouth/Throat:     Mouth: Mucous membranes are moist.     Pharynx: Oropharynx is clear. Uvula midline. No posterior oropharyngeal erythema.     Tonsils: No tonsillar exudate or tonsillar abscesses.  Eyes:     Conjunctiva/sclera: Conjunctivae normal.     Pupils: Pupils are equal, round, and reactive to light.  Cardiovascular:     Rate and Rhythm: Normal rate and regular rhythm.     Heart sounds: Normal heart sounds.  Pulmonary:     Effort: Pulmonary effort is normal.     Breath sounds: Normal breath sounds.  Musculoskeletal:     Cervical back: Normal range of motion and neck supple.  Lymphadenopathy:     Cervical: No cervical adenopathy.  Skin:    General: Skin is warm and dry.  Neurological:     General: No focal deficit present.     Mental Status: She is alert and oriented to person, place, and time.  Psychiatric:        Mood and Affect: Mood normal.        Behavior: Behavior normal.      UC Treatments / Results  Labs (all labs ordered are listed, but only abnormal results are displayed) Labs Reviewed - No data to display  EKG   Radiology No results found.  Procedures Procedures (including critical care time)  Medications Ordered in UC Medications - No data to display  Initial Impression / Assessment and Plan / UC Course  I have reviewed the triage vital signs and the nursing notes.  Pertinent labs & imaging results that were available during my care of the patient were reviewed by me and considered in my medical decision making (see chart for details).     Reviewed exam and symptoms with patient.  No red flags.  Start doxycycline twice daily for 7 days.  Continue Flonase and nasal rinses.  Strongly encourage ENT follow-up if symptoms persist or recur.  ER precautions reviewed  and patient verbalized understanding. Final Clinical Impressions(s) / UC Diagnoses   Final diagnoses:  Acute recurrent maxillary sinusitis     Discharge Instructions      Start doxycycline twice daily for 7 days.  Continue Flonase and nasal rinses as needed.  Please follow-up with your PCP or ear nose and throat for further workup of your symptoms if they do not improve.  Please go to the ER for any worsening symptoms.  I hope you feel better soon!    ED Prescriptions     Medication Sig Dispense Auth. Provider   doxycycline (VIBRAMYCIN) 100 MG capsule Take 1 capsule (100 mg total) by mouth 2 (two) times daily for 7 days. 14 capsule Radford Pax, NP      PDMP not reviewed this encounter.   Radford Pax, NP 09/04/23 1351    Radford Pax, NP 09/04/23 1351

## 2023-09-04 NOTE — ED Triage Notes (Signed)
Patient states " I think I have a sinus infection" C/O facial pain, headache x one week. Patient using Day Quil and flonase

## 2023-09-04 NOTE — Discharge Instructions (Addendum)
Start doxycycline twice daily for 7 days.  Continue Flonase and nasal rinses as needed.  Please follow-up with your PCP or ear nose and throat for further workup of your symptoms if they do not improve.  Please go to the ER for any worsening symptoms.  I hope you feel better soon!

## 2023-09-13 ENCOUNTER — Encounter: Payer: Self-pay | Admitting: Family Medicine

## 2023-11-28 ENCOUNTER — Ambulatory Visit
Admission: EM | Admit: 2023-11-28 | Discharge: 2023-11-28 | Disposition: A | Discharge status: Home / Self Care | Attending: Family Medicine | Admitting: Family Medicine

## 2023-11-28 DIAGNOSIS — H6993 Unspecified Eustachian tube disorder, bilateral: Secondary | ICD-10-CM | POA: Diagnosis not present

## 2023-11-28 DIAGNOSIS — J309 Allergic rhinitis, unspecified: Secondary | ICD-10-CM

## 2023-11-28 DIAGNOSIS — J0191 Acute recurrent sinusitis, unspecified: Secondary | ICD-10-CM | POA: Diagnosis not present

## 2023-11-28 MED ORDER — PREDNISONE 20 MG PO TABS: ORAL_TABLET | ORAL | 0 refills | Status: DC

## 2023-11-28 MED ORDER — AMOXICILLIN-POT CLAVULANATE 875-125 MG PO TABS: 1.0000 | ORAL_TABLET | Freq: Two times a day (BID) | ORAL | 0 refills | Status: DC

## 2023-11-28 NOTE — ED Triage Notes (Signed)
 Pt c/o nasal congestion, sinus pain/pressure, HA-sx started 2 weeks ago-NAD-steady gait

## 2023-11-28 NOTE — ED Provider Notes (Signed)
 [name]  Note:  This document was prepared using Conservation officer, historic buildings and may include unintentional dictation errors.  MRN: [id number] DOB: [birth year]  Subjective:   Lori Blackwell is a 33 y.o. female presenting for 2-week history of acute onset persistent sinus congestion, sinus drainage, sinus pressure, bilateral intermittent ear pain, sinus headaches.  No chest pain, shortness of breath or wheezing.  Has longstanding history of allergies, asthma, recurrent sinus infections.  Has been using Flonase and Claritin.   No current facility-administered medications for this encounter.  Current Outpatient Medications:    EPINEPHrine  0.3 mg/0.3 mL IJ SOAJ injection, Inject 0.3 mg into the muscle as needed., Disp: 2 each, Rfl: 2   mupirocin ointment (BACTROBAN) 2 %, SMARTSIG:1 Application Topical 2-3 Times Daily, Disp: , Rfl:    Allergies  Allergen Reactions   Shellfish Allergy Anaphylaxis   Sulfa Antibiotics Rash    Past Medical History:  Diagnosis Date   Allergy    Asthma    SEASONAL    Bronchitis    Chicken pox    Chlamydia    from prenatal   Complication of anesthesia    HARD TIME WAKING UP   Dysrhythmia    H/O IN COLLEGE-HAD EKG WHICH PT STATES WAS NORMAL-NO PROBLEMS SINCE   Heart murmur    BORN WITH HEART MURMUR-OUTGREW-ASYMPTOMATIC   HPV (human papilloma virus) infection    from prenatal   Kidney stone    PONV (postoperative nausea and vomiting)      Past Surgical History:  Procedure Laterality Date   APPENDECTOMY  2001   BREAST BIOPSY  2020   fibroabnormal   CESAREAN SECTION N/A 04/15/2021   Procedure: CESAREAN SECTION;  Surgeon: Luan Rumpf, MD;  Location: [facility];  Service: Obstetrics;  Laterality: N/A;   CESAREAN SECTION N/A 10/08/2022   Procedure: CESAREAN SECTION;  Surgeon: Luan Rumpf, MD;  Location: [facility];  Service: Obstetrics;  Laterality: N/A;   LEEP     NASAL SINUS SURGERY N/A 11/13/2015   Procedure:  ENDOSCOPIC SINUS SURGERY;  Surgeon: Rogers Clayman, MD;  Location: [facility];  Service: ENT;  Laterality: N/A;   SEPTOPLASTY WITH ETHMOIDECTOMY, AND MAXILLARY ANTROSTOMY Bilateral 11/13/2015   Procedure: SEPTOPLASTY WITH ETHMOIDECTOMY, AND MAXILLARY ANTROSTOMY;  Surgeon: Rogers Clayman, MD;  Location: [facility];  Service: ENT;  Laterality: Bilateral;  bilateral maxillary antrostomy, left anterior ethmoidectomy, left frontal sinusotomy   SHOULDER SURGERY Right 2008   2011   TURBINATE REDUCTION Bilateral 11/13/2015   Procedure: TURBINATE REDUCTION;  Surgeon: Rogers Clayman, MD;  Location: [facility];  Service: ENT;  Laterality: Bilateral;   WISDOM TOOTH EXTRACTION      Family History  Problem Relation Age of Onset   Hyperlipidemia Mother    Healthy Father    Cancer Maternal Aunt        breast cancer   Cancer Maternal Grandmother        breast cancer   Colon cancer Neg Hx    Esophageal cancer Neg Hx     Social History   Tobacco Use   Smoking status: Never   Smokeless tobacco: Never  Vaping Use   Vaping status: Never Used  Substance Use Topics   Alcohol use: Not Currently   Drug use: No    ROS   Objective:   Vitals: BP (P) 113/77 (BP Location: Right Arm)   Pulse (P) 60   Temp (P) 98.7 F (37.1 C) (Oral)   Resp (P) 16  Physical Exam Constitutional:      General: She is not in acute distress.    Appearance: Normal appearance. She is well-developed and normal weight. She is not ill-appearing, toxic-appearing or diaphoretic.  HENT:     Head: Normocephalic and atraumatic.     Right Ear: Tympanic membrane, ear canal and external ear normal. No drainage or tenderness. No middle ear effusion. There is no impacted cerumen. Tympanic membrane is not erythematous or bulging.     Left Ear: Tympanic membrane, ear canal and external ear normal. No drainage or tenderness.  No middle ear effusion. There is no impacted cerumen. Tympanic membrane is not erythematous or bulging.      Nose: Congestion present. No rhinorrhea.     Mouth/Throat:     Mouth: Mucous membranes are moist. No oral lesions.     Pharynx: No pharyngeal swelling, oropharyngeal exudate, posterior oropharyngeal erythema or uvula swelling.     Tonsils: No tonsillar exudate or tonsillar abscesses.  Eyes:     General: No scleral icterus.       Right eye: No discharge.        Left eye: No discharge.     Extraocular Movements: Extraocular movements intact.     Right eye: Normal extraocular motion.     Left eye: Normal extraocular motion.     Conjunctiva/sclera: Conjunctivae normal.  Cardiovascular:     Rate and Rhythm: Normal rate and regular rhythm.     Heart sounds: Normal heart sounds. No murmur heard.    No friction rub. No gallop.  Pulmonary:     Effort: Pulmonary effort is normal. No respiratory distress.     Breath sounds: No stridor. No wheezing, rhonchi or rales.  Chest:     Chest wall: No tenderness.  Musculoskeletal:     Cervical back: Normal range of motion and neck supple.  Lymphadenopathy:     Cervical: No cervical adenopathy.  Skin:    General: Skin is warm and dry.  Neurological:     General: No focal deficit present.     Mental Status: She is alert and oriented to person, place, and time.  Psychiatric:        Mood and Affect: Mood normal.        Behavior: Behavior normal.     Assessment and Plan :   PDMP not reviewed this encounter.  1. Acute recurrent sinusitis, unspecified location   2. Allergic rhinitis, unspecified seasonality, unspecified trigger   3. Eustachian tube dysfunction, bilateral    Will manage for recurrent sinus infection likely secondary to allergic rhinitis flare versus a viral upper respiratory infection.  Recommend an oral prednisone  course, Augmentin .  Deferred imaging given clear cardiopulmonary exam, hemodynamically stable vital signs.  Counseled patient on potential for adverse effects with medications prescribed/recommended today, ER and  return-to-clinic precautions discussed, patient verbalized understanding.    Adolph Hoop, PA-C 11/28/23 1229

## 2023-11-28 NOTE — Discharge Instructions (Signed)
 We will manage this as a sinus infection with Augmentin . For the allergic rhinitis, use prednisone  for 5 days. For sore throat or cough try using a honey-based tea. Use 3 teaspoons of honey with juice squeezed from half lemon. Place shaved pieces of ginger into 1/2-1 cup of water  and warm over stove top. Then mix the ingredients and repeat every 4 hours as needed. Please take Tylenol  500mg -650mg  every 6 hours for throat pain, fevers, aches and pains. Hydrate very well with at least 2 liters of water . Eat light meals such as soups (chicken and noodles, vegetable, chicken and wild rice).  Do not eat foods that you are allergic to.  Taking an antihistamine like Zyrtec  can help against postnasal drainage, sinus congestion which can cause sinus pain, sinus headaches, throat pain, painful swallowing, coughing.  You can take this together with prednisone . Following the prednisone  course can use pseudoephedrine  (Sudafed) at a dose of 30mg -60 mg 3 times a day or twice daily as needed for the same kind of nasal drip, congestion.

## 2023-12-15 DIAGNOSIS — L811 Chloasma: Secondary | ICD-10-CM | POA: Diagnosis not present

## 2023-12-15 DIAGNOSIS — D225 Melanocytic nevi of trunk: Secondary | ICD-10-CM | POA: Diagnosis not present

## 2023-12-15 DIAGNOSIS — L814 Other melanin hyperpigmentation: Secondary | ICD-10-CM | POA: Diagnosis not present

## 2023-12-15 DIAGNOSIS — L821 Other seborrheic keratosis: Secondary | ICD-10-CM | POA: Diagnosis not present

## 2024-01-23 ENCOUNTER — Ambulatory Visit
Admission: EM | Admit: 2024-01-23 | Discharge: 2024-01-23 | Disposition: A | Discharge status: Home / Self Care | Attending: Family Medicine | Admitting: Family Medicine

## 2024-01-23 DIAGNOSIS — J0181 Other acute recurrent sinusitis: Secondary | ICD-10-CM | POA: Diagnosis not present

## 2024-01-23 DIAGNOSIS — J329 Chronic sinusitis, unspecified: Secondary | ICD-10-CM | POA: Diagnosis not present

## 2024-01-23 MED ORDER — PREDNISONE 20 MG PO TABS: ORAL_TABLET | ORAL | 0 refills | Status: DC

## 2024-01-23 MED ORDER — CEFDINIR 300 MG PO CAPS: 300.0000 mg | ORAL_CAPSULE | Freq: Two times a day (BID) | ORAL | 0 refills | Status: DC

## 2024-01-23 NOTE — ED Triage Notes (Signed)
 Pt c/o sinus pain, pressure, nasal congestion x 10 days-denies fever-NAD-steady gait

## 2024-01-23 NOTE — Discharge Instructions (Signed)
 Start prednisone  to help with acute on chronic rhinitis. If you experience no relief then start cefdinir as an antibiotic to address recurrent sinus infection.

## 2024-01-23 NOTE — ED Provider Notes (Signed)
 [name]  Note:  This document was prepared using Conservation officer, historic buildings and may include unintentional dictation errors.  MRN: [id number] DOB: Aug 01, 1991  Subjective:   Lori Blackwell is a 33 y.o. female presenting for 10 day history of recurrent sinus congestion, drainage and sinus pain. Both ears are full and painful. Has a slight cough. No fever, chest pain, shob, n/v, abdominal pain, rashes. Has a history of sinus surgery in 2017.  Has not followed up with them.  Last sinus infection was 2 months ago.  Underwent a course of Augmentin .  Usually she also takes steroids.  No smoking of any kind including cigarettes, cigars, vaping, marijuana use.  She does take an antihistamine daily.  Has tried multiple nasal sprays but none has been effective for her.  No current facility-administered medications for this encounter.  Current Outpatient Medications:    amoxicillin -clavulanate (AUGMENTIN ) 875-125 MG tablet, Take 1 tablet by mouth 2 (two) times daily., Disp: 20 tablet, Rfl: 0   EPINEPHrine  0.3 mg/0.3 mL IJ SOAJ injection, Inject 0.3 mg into the muscle as needed., Disp: 2 each, Rfl: 2   mupirocin ointment (BACTROBAN) 2 %, SMARTSIG:1 Application Topical 2-3 Times Daily, Disp: , Rfl:    predniSONE  (DELTASONE ) 20 MG tablet, Take 2 tablets daily with breakfast., Disp: 10 tablet, Rfl: 0   Allergies  Allergen Reactions   Shellfish Allergy Anaphylaxis   Sulfa Antibiotics Rash    Past Medical History:  Diagnosis Date   Allergy    Asthma    SEASONAL    Bronchitis    Chicken pox    Chlamydia    from prenatal   Complication of anesthesia    HARD TIME WAKING UP   Dysrhythmia    H/O IN COLLEGE-HAD EKG WHICH PT STATES WAS NORMAL-NO PROBLEMS SINCE   Heart murmur    BORN WITH HEART MURMUR-OUTGREW-ASYMPTOMATIC   HPV (human papilloma virus) infection    from prenatal   Kidney stone    PONV (postoperative nausea and vomiting)      Past Surgical  History:  Procedure Laterality Date   APPENDECTOMY  2001   BREAST BIOPSY  2020   fibroabnormal   CESAREAN SECTION N/A 04/15/2021   Procedure: CESAREAN SECTION;  Surgeon: Luan Rumpf, MD;  Location: [facility];  Service: Obstetrics;  Laterality: N/A;   CESAREAN SECTION N/A 10/08/2022   Procedure: CESAREAN SECTION;  Surgeon: Luan Rumpf, MD;  Location: [facility];  Service: Obstetrics;  Laterality: N/A;   LEEP     NASAL SINUS SURGERY N/A 11/13/2015   Procedure: ENDOSCOPIC SINUS SURGERY;  Surgeon: Rogers Clayman, MD;  Location: [facility];  Service: ENT;  Laterality: N/A;   SEPTOPLASTY WITH ETHMOIDECTOMY, AND MAXILLARY ANTROSTOMY Bilateral 11/13/2015   Procedure: SEPTOPLASTY WITH ETHMOIDECTOMY, AND MAXILLARY ANTROSTOMY;  Surgeon: Rogers Clayman, MD;  Location: [facility];  Service: ENT;  Laterality: Bilateral;  bilateral maxillary antrostomy, left anterior ethmoidectomy, left frontal sinusotomy   SHOULDER SURGERY Right 2008   2011   TURBINATE REDUCTION Bilateral 11/13/2015   Procedure: TURBINATE REDUCTION;  Surgeon: Rogers Clayman, MD;  Location: [facility];  Service: ENT;  Laterality: Bilateral;   WISDOM TOOTH EXTRACTION      Family History  Problem Relation Age of Onset   Hyperlipidemia Mother    Healthy Father    Cancer Maternal Aunt        breast cancer   Cancer Maternal Grandmother        breast cancer   Colon  cancer Neg Hx    Esophageal cancer Neg Hx     Social History   Tobacco Use   Smoking status: Never   Smokeless tobacco: Never  Vaping Use   Vaping status: Never Used  Substance Use Topics   Alcohol use: Not Currently   Drug use: No    ROS   Objective:   Vitals: BP 124/82 (BP Location: Right Arm)   Pulse 62   Temp 97.6 F (36.4 C) (Oral)   Resp 16   LMP 01/16/2024   SpO2 97%   Breastfeeding No   Physical Exam Constitutional:      General: She is not in acute distress.    Appearance: Normal appearance. She is well-developed and normal weight. She is  not ill-appearing, toxic-appearing or diaphoretic.  HENT:     Head: Normocephalic and atraumatic.     Right Ear: Tympanic membrane, ear canal and external ear normal. No drainage or tenderness. No middle ear effusion. There is no impacted cerumen. Tympanic membrane is not erythematous or bulging.     Left Ear: Tympanic membrane, ear canal and external ear normal. No drainage or tenderness.  No middle ear effusion. There is no impacted cerumen. Tympanic membrane is not erythematous or bulging.     Nose: Congestion and rhinorrhea present.     Comments: Maxillary sinus tenderness.    Mouth/Throat:     Mouth: Mucous membranes are moist. No oral lesions.     Pharynx: No pharyngeal swelling, oropharyngeal exudate, posterior oropharyngeal erythema or uvula swelling.     Tonsils: No tonsillar exudate or tonsillar abscesses.   Eyes:     General: No scleral icterus.       Right eye: No discharge.        Left eye: No discharge.     Extraocular Movements: Extraocular movements intact.     Right eye: Normal extraocular motion.     Left eye: Normal extraocular motion.     Conjunctiva/sclera: Conjunctivae normal.    Cardiovascular:     Rate and Rhythm: Normal rate and regular rhythm.     Heart sounds: Normal heart sounds. No murmur heard.    No friction rub. No gallop.  Pulmonary:     Effort: Pulmonary effort is normal. No respiratory distress.     Breath sounds: No stridor. No wheezing, rhonchi or rales.  Chest:     Chest wall: No tenderness.   Musculoskeletal:     Cervical back: Normal range of motion and neck supple.  Lymphadenopathy:     Cervical: No cervical adenopathy.   Skin:    General: Skin is warm and dry.   Neurological:     General: No focal deficit present.     Mental Status: She is alert and oriented to person, place, and time.   Psychiatric:        Mood and Affect: Mood normal.        Behavior: Behavior normal.     Assessment and Plan :   PDMP not reviewed this  encounter.  1. Other acute recurrent sinusitis   2. Chronic sinusitis, unspecified location    Recommended patient start with a trial of prednisone  as this may be sufficient to address her acute on chronic recurrent sinusitis.  I did provide her with prescription for cefdinir in the event that she does not achieve any kind of improvement or resolution with prednisone  alone.  This is an effort to avoid antibiotic resistance and overuse.  Patient is in agreement.  I am using cefdinir for the same reason as she just took a course of Augmentin .  Recommend general supportive care otherwise.  Follow-up with ENT.  Counseled patient on potential for adverse effects with medications prescribed/recommended today, ER and return-to-clinic precautions discussed, patient verbalized understanding.    Adolph Hoop, [place] 01/23/24 1949

## 2024-01-24 DIAGNOSIS — Z9889 Other specified postprocedural states: Secondary | ICD-10-CM | POA: Diagnosis not present

## 2024-01-24 DIAGNOSIS — H6993 Unspecified Eustachian tube disorder, bilateral: Secondary | ICD-10-CM | POA: Diagnosis not present

## 2024-01-24 DIAGNOSIS — J0141 Acute recurrent pansinusitis: Secondary | ICD-10-CM | POA: Diagnosis not present

## 2024-02-15 DIAGNOSIS — N944 Primary dysmenorrhea: Secondary | ICD-10-CM | POA: Diagnosis not present

## 2024-02-15 DIAGNOSIS — Z30018 Encounter for initial prescription of other contraceptives: Secondary | ICD-10-CM | POA: Diagnosis not present

## 2024-02-15 DIAGNOSIS — N925 Other specified irregular menstruation: Secondary | ICD-10-CM | POA: Diagnosis not present

## 2024-03-06 DIAGNOSIS — H1013 Acute atopic conjunctivitis, bilateral: Secondary | ICD-10-CM | POA: Diagnosis not present

## 2024-03-26 DIAGNOSIS — R519 Headache, unspecified: Secondary | ICD-10-CM | POA: Diagnosis not present

## 2024-03-26 DIAGNOSIS — J329 Chronic sinusitis, unspecified: Secondary | ICD-10-CM | POA: Diagnosis not present

## 2024-03-27 DIAGNOSIS — M778 Other enthesopathies, not elsewhere classified: Secondary | ICD-10-CM | POA: Diagnosis not present

## 2024-03-27 DIAGNOSIS — M25532 Pain in left wrist: Secondary | ICD-10-CM | POA: Diagnosis not present

## 2024-05-19 DIAGNOSIS — Z91013 Allergy to seafood: Secondary | ICD-10-CM | POA: Diagnosis not present

## 2024-05-19 DIAGNOSIS — T7840XA Allergy, unspecified, initial encounter: Secondary | ICD-10-CM | POA: Diagnosis not present

## 2024-05-19 DIAGNOSIS — R0789 Other chest pain: Secondary | ICD-10-CM | POA: Diagnosis not present

## 2024-05-19 DIAGNOSIS — Z881 Allergy status to other antibiotic agents status: Secondary | ICD-10-CM | POA: Diagnosis not present

## 2024-05-19 DIAGNOSIS — J9801 Acute bronchospasm: Secondary | ICD-10-CM | POA: Diagnosis not present

## 2024-05-19 DIAGNOSIS — R0602 Shortness of breath: Secondary | ICD-10-CM | POA: Diagnosis not present

## 2024-06-04 ENCOUNTER — Ambulatory Visit
Admission: EM | Admit: 2024-06-04 | Discharge: 2024-06-04 | Disposition: A | Discharge status: Home / Self Care | Attending: Family Medicine | Admitting: Family Medicine

## 2024-06-04 ENCOUNTER — Other Ambulatory Visit: Payer: Self-pay

## 2024-06-04 DIAGNOSIS — J320 Chronic maxillary sinusitis: Secondary | ICD-10-CM | POA: Diagnosis not present

## 2024-06-04 MED ORDER — PREDNISONE 20 MG PO TABS: 40.0000 mg | ORAL_TABLET | Freq: Every day | ORAL | 0 refills | Status: AC

## 2024-06-04 MED ORDER — CEFDINIR 300 MG PO CAPS: 300.0000 mg | ORAL_CAPSULE | Freq: Two times a day (BID) | ORAL | 0 refills | Status: AC

## 2024-06-04 NOTE — ED Provider Notes (Signed)
 [facility]    CSN: [id number] Arrival date & time: 06/04/24  9146      History   Chief Complaint Chief Complaint  Patient presents with   Headache   Facial Pain   Cough    HPI Lori Blackwell is a 33 y.o. female  presents for evaluation of URI symptoms for 5 days. Patient reports associated symptoms of sinus pressure/pain with purulent nasal discharge, ear pain, congestion for. Denies N/V/D, fevers, cough, sore throat, body aches, shortness of breath. Patient does not have a hx of asthma. Patient is not an active smoker.   Reports sick contacts via her children.  Has a history of recurrent sinus infections and has seen ENT.  States the symptoms seem worse than normal.  Pt has taken oral rinses and DayQuil OTC for symptoms. Pt has no other concerns at this time.    Headache Associated symptoms: congestion, cough and sinus pressure   Cough Associated symptoms: headaches     Past Medical History:  Diagnosis Date   Allergy    Asthma    SEASONAL    Bronchitis    Chicken pox    Chlamydia    from prenatal   Complication of anesthesia    HARD TIME WAKING UP   Dysrhythmia    H/O IN COLLEGE-HAD EKG WHICH PT STATES WAS NORMAL-NO PROBLEMS SINCE   Heart murmur    BORN WITH HEART MURMUR-OUTGREW-ASYMPTOMATIC   HPV (human papilloma virus) infection    from prenatal   Kidney stone    PONV (postoperative nausea and vomiting)     Patient Active Problem List   Diagnosis Date Noted   History of cesarean delivery, antepartum 10/08/2022   History of cesarean delivery 10/08/2022   Full-term premature rupture of membranes (PROM) with unknown onset of labor 04/15/2021   Irritable bowel syndrome with both constipation and diarrhea 01/19/2019   Acute upper respiratory infection 12/16/2015   ASCUS favoring benign 12/05/2015   Recurrent sinusitis 09/16/2015    Past Surgical History:  Procedure Laterality Date   APPENDECTOMY  2001   BREAST BIOPSY  2020   fibroabnormal    CESAREAN SECTION N/A 04/15/2021   Procedure: CESAREAN SECTION;  Surgeon: Gretta Gums, MD;  Location: [facility];  Service: Obstetrics;  Laterality: N/A;   CESAREAN SECTION N/A 10/08/2022   Procedure: CESAREAN SECTION;  Surgeon: Gretta Gums, MD;  Location: [facility];  Service: Obstetrics;  Laterality: N/A;   LEEP     NASAL SINUS SURGERY N/A 11/13/2015   Procedure: ENDOSCOPIC SINUS SURGERY;  Surgeon: Carolee Hunter, MD;  Location: [facility];  Service: ENT;  Laterality: N/A;   SEPTOPLASTY WITH ETHMOIDECTOMY, AND MAXILLARY ANTROSTOMY Bilateral 11/13/2015   Procedure: SEPTOPLASTY WITH ETHMOIDECTOMY, AND MAXILLARY ANTROSTOMY;  Surgeon: Carolee Hunter, MD;  Location: [facility];  Service: ENT;  Laterality: Bilateral;  bilateral maxillary antrostomy, left anterior ethmoidectomy, left frontal sinusotomy   SHOULDER SURGERY Right 2008   2011   TURBINATE REDUCTION Bilateral 11/13/2015   Procedure: TURBINATE REDUCTION;  Surgeon: Carolee Hunter, MD;  Location: [facility] OR[facility];  Service: ENT;  Laterality: Bilateral;   WISDOM TOOTH EXTRACTION      OB History     Gravida  2   Para  2   Term  2   Preterm      AB      Living  2      SAB      IAB      Ectopic  Multiple  0   Live Births  2            Home Medications    Prior to Admission medications   Medication Sig Start Date End Date Taking? Authorizing Provider  cefdinir  (OMNICEF ) 300 MG capsule Take 1 capsule (300 mg total) by mouth 2 (two) times daily for 7 days. 06/04/24 06/11/24 Yes Rakhi Romagnoli, Jodi R, NP  etonogestrel-ethinyl estradiol (NUVARING) 0.12-0.015 MG/24HR vaginal ring Place 1 each vaginally every 28 (twenty-eight) days. 02/15/24  Yes [provider]  predniSONE  (DELTASONE ) 20 MG tablet Take 2 tablets (40 mg total) by mouth daily with breakfast for 5 days. 06/04/24 06/09/24 Yes Abbas Beyene, Jodi R, NP  EPINEPHrine  0.3 mg/0.3 mL IJ SOAJ injection Inject 0.3 mg into the muscle as needed. 01/21/23   Mercer Clotilda SAUNDERS, MD     Family History Family History  Problem Relation Age of Onset   Hyperlipidemia Mother    Healthy Father    Cancer Maternal Aunt        breast cancer   Cancer Maternal Grandmother        breast cancer   Colon cancer Neg Hx    Esophageal cancer Neg Hx     Social History Social History   Tobacco Use   Smoking status: Never   Smokeless tobacco: Never  Vaping Use   Vaping status: Never Used  Substance Use Topics   Alcohol use: Not Currently   Drug use: No     Allergies   Shellfish allergy and Sulfa antibiotics   Review of Systems Review of Systems  HENT:  Positive for congestion, sinus pressure and sinus pain.   Respiratory:  Positive for cough.   Neurological:  Positive for headaches.     Physical Exam Triage Vital Signs ED Triage Vitals  Encounter Vitals Group     BP 06/04/24 0925 114/77     Girls Systolic BP Percentile --      Girls Diastolic BP Percentile --      Boys Systolic BP Percentile --      Boys Diastolic BP Percentile --      Pulse Rate 06/04/24 0925 68     Resp 06/04/24 0925 17     Temp 06/04/24 0925 98.1 F (36.7 C)     Temp Source 06/04/24 0925 Oral     SpO2 06/04/24 0925 98 %     Weight --      Height --      Head Circumference --      Peak Flow --      Pain Score 06/04/24 0923 6     Pain Loc --      Pain Education --      Exclude from Growth Chart --    No data found.  Updated Vital Signs BP 114/77   Pulse 68   Temp 98.1 F (36.7 C) (Oral)   Resp 17   LMP 06/01/2024   SpO2 98%   Breastfeeding No   Visual Acuity Right Eye Distance:   Left Eye Distance:   Bilateral Distance:    Right Eye Near:   Left Eye Near:    Bilateral Near:     Physical Exam Vitals and nursing note reviewed.  Constitutional:      General: She is not in acute distress.    Appearance: She is well-developed. She is not ill-appearing.  HENT:     Head: Normocephalic and atraumatic.     Right Ear: Tympanic membrane and ear canal normal.  Left Ear: Tympanic membrane and ear canal normal.     Nose: Congestion present.     Right Turbinates: Swollen and pale.     Left Turbinates: Swollen and pale.     Right Sinus: Maxillary sinus tenderness and frontal sinus tenderness present.     Left Sinus: Maxillary sinus tenderness and frontal sinus tenderness present.     Mouth/Throat:     Mouth: Mucous membranes are moist.     Pharynx: Oropharynx is clear. Uvula midline. No oropharyngeal exudate or posterior oropharyngeal erythema.     Tonsils: No tonsillar exudate or tonsillar abscesses.  Eyes:     Conjunctiva/sclera: Conjunctivae normal.     Pupils: Pupils are equal, round, and reactive to light.  Cardiovascular:     Rate and Rhythm: Normal rate and regular rhythm.     Heart sounds: Normal heart sounds.  Pulmonary:     Effort: Pulmonary effort is normal.     Breath sounds: Normal breath sounds.  Musculoskeletal:     Cervical back: Normal range of motion and neck supple.  Lymphadenopathy:     Cervical: No cervical adenopathy.  Skin:    General: Skin is warm and dry.  Neurological:     General: No focal deficit present.     Mental Status: She is alert and oriented to person, place, and time.  Psychiatric:        Mood and Affect: Mood normal.        Behavior: Behavior normal.      UC Treatments / Results  Labs (all labs ordered are listed, but only abnormal results are displayed) Labs Reviewed - No data to display  EKG   Radiology No results found.  Procedures Procedures (including critical care time)  Medications Ordered in UC Medications - No data to display  Initial Impression / Assessment and Plan / UC Course  I have reviewed the triage vital signs and the nursing notes.  Pertinent labs & imaging results that were available during my care of the patient were reviewed by me and considered in my medical decision making (see chart for details).     Reviewed exam and symptoms with patient.  Will start  cefdinir  and prednisone .  She has Flonase at home which she will start using.  Advised continuation of nasal rinses as needed.  PCP or ENT follow-up if symptoms do not improve.  ER precautions reviewed Final Clinical Impressions(s) / UC Diagnoses   Final diagnoses:  Chronic maxillary sinusitis     Discharge Instructions      Start cefdinir  twice daily and prednisone  daily.  Continue nasal rinses and start using Flonase nasal spray daily as well.  Please follow-up with your PCP if your symptoms do not improve.  Please go to the emergency room if you develop any worsening symptoms.  Hope you feel better soon!    ED Prescriptions     Medication Sig Dispense Auth. Provider   cefdinir  (OMNICEF ) 300 MG capsule Take 1 capsule (300 mg total) by mouth 2 (two) times daily for 7 days. 14 capsule Lin Glazier, Jodi R, NP   predniSONE  (DELTASONE ) 20 MG tablet Take 2 tablets (40 mg total) by mouth daily with breakfast for 5 days. 10 tablet Avital Dancy, Jodi R, NP      PDMP not reviewed this encounter.   Loreda Myla SAUNDERS, NP 06/04/24 1000

## 2024-06-04 NOTE — ED Triage Notes (Signed)
 Pt c/o sinus pressure, ear pain bilat, nasal congestion, productive cough w/green [id number]

## 2024-06-04 NOTE — Discharge Instructions (Signed)
 Start cefdinir  twice daily and prednisone  daily.  Continue nasal rinses and start using Flonase nasal spray daily as well.  Please follow-up with your PCP if your symptoms do not improve.  Please go to the emergency room if you develop any worsening symptoms.  Hope you feel better soon!

## 2024-07-27 ENCOUNTER — Telehealth: Payer: Self-pay

## 2024-07-27 DIAGNOSIS — Z01411 Encounter for gynecological examination (general) (routine) with abnormal findings: Secondary | ICD-10-CM | POA: Diagnosis not present

## 2024-07-27 DIAGNOSIS — F3281 Premenstrual dysphoric disorder: Secondary | ICD-10-CM | POA: Diagnosis not present

## 2024-07-27 NOTE — Telephone Encounter (Signed)
 Copied from CRM [id number]. Topic: Clinical - Red Word Triage >> Jul 27, 2024 10:48 AM Adelita E wrote: [facility] that prompted transfer to Nurse Triage: Dizzy spells going on for 2 months. >> Jul 27, 2024 11:02 AM Alexandria E wrote: Patient did not want to keep holding and stated that she would rather not speak with a nurse and just make the appointment. Please call patient when available to set up appointment.

## 2024-08-01 ENCOUNTER — Ambulatory Visit
Admission: EM | Admit: 2024-08-01 | Discharge: 2024-08-01 | Disposition: A | Discharge status: Home / Self Care | Attending: Family Medicine | Admitting: Family Medicine

## 2024-08-01 DIAGNOSIS — J329 Chronic sinusitis, unspecified: Secondary | ICD-10-CM | POA: Diagnosis not present

## 2024-08-01 MED ORDER — AMOXICILLIN-POT CLAVULANATE 875-125 MG PO TABS: 1.0000 | ORAL_TABLET | Freq: Two times a day (BID) | ORAL | 0 refills | Status: AC

## 2024-08-01 MED ORDER — PREDNISONE 50 MG PO TABS: 50.0000 mg | ORAL_TABLET | Freq: Every day | ORAL | 0 refills | Status: AC

## 2024-08-01 NOTE — ED Triage Notes (Signed)
 Pt c/o sinus pain/pressure and nasal congestion x 10 days-taking sudafed and flonase-NAD-steady gait

## 2024-08-01 NOTE — ED Provider Notes (Signed)
 " [facility]  Note:  This document was prepared using Conservation officer, historic buildings and may include unintentional dictation errors.  MRN: [id number] DOB: [birth year]  Subjective:   Lori Blackwell is a 33 y.o. female presenting for 10-day history of acute on chronic recurrent sinus pain, sinus pressure, sinus congestion.  Patient has been using her Flonase, Sudafed without relief.  Has a history of chronic and recurrent sinus infection, chronic sinusitis.  Has a history of a nasal sinus surgery in 2017.  She just had follow-up with a different specialist about 6 months ago.  They only recommended that she do steroid nasal rinses when she flares up.  Current Outpatient Medications  Medication Instructions   EPINEPHrine  (EPI-PEN) 0.3 mg, Intramuscular, As needed   etonogestrel-ethinyl estradiol (NUVARING) 0.12-0.015 MG/24HR vaginal ring 1 each, Every 28 days    Allergies[1]  Past Medical History:  Diagnosis Date   Allergy    Asthma    SEASONAL    Bronchitis    Chicken pox    Chlamydia    from prenatal   Complication of anesthesia    HARD TIME WAKING UP   Dysrhythmia    H/O IN COLLEGE-HAD EKG WHICH PT STATES WAS NORMAL-NO PROBLEMS SINCE   Heart murmur    BORN WITH HEART MURMUR-OUTGREW-ASYMPTOMATIC   HPV (human papilloma virus) infection    from prenatal   Kidney stone    PONV (postoperative nausea and vomiting)      Past Surgical History:  Procedure Laterality Date   APPENDECTOMY  2001   BREAST BIOPSY  2020   fibroabnormal   CESAREAN SECTION N/A 04/15/2021   Procedure: CESAREAN SECTION;  Surgeon: Gretta Gums, MD;  Location: [facility];  Service: Obstetrics;  Laterality: N/A;   CESAREAN SECTION N/A 10/08/2022   Procedure: CESAREAN SECTION;  Surgeon: Gretta Gums, MD;  Location: [facility];  Service: Obstetrics;  Laterality: N/A;   LEEP     NASAL SINUS SURGERY N/A 11/13/2015   Procedure: ENDOSCOPIC SINUS SURGERY;  Surgeon: Carolee Hunter, MD;   Location: [facility];  Service: ENT;  Laterality: N/A;   SEPTOPLASTY WITH ETHMOIDECTOMY, AND MAXILLARY ANTROSTOMY Bilateral 11/13/2015   Procedure: SEPTOPLASTY WITH ETHMOIDECTOMY, AND MAXILLARY ANTROSTOMY;  Surgeon: Carolee Hunter, MD;  Location: [facility];  Service: ENT;  Laterality: Bilateral;  bilateral maxillary antrostomy, left anterior ethmoidectomy, left frontal sinusotomy   SHOULDER SURGERY Right 2008   2011   TURBINATE REDUCTION Bilateral 11/13/2015   Procedure: TURBINATE REDUCTION;  Surgeon: Carolee Hunter, MD;  Location: [facility];  Service: ENT;  Laterality: Bilateral;   WISDOM TOOTH EXTRACTION      Family History  Problem Relation Age of Onset   Hyperlipidemia Mother    Healthy Father    Cancer Maternal Aunt        breast cancer   Cancer Maternal Grandmother        breast cancer   Colon cancer Neg Hx    Esophageal cancer Neg Hx     Social History   Occupational History   Occupation: Education Officer, Environmental  Tobacco Use   Smoking status: Never   Smokeless tobacco: Never  Vaping Use   Vaping status: Never Used  Substance and Sexual Activity   Alcohol use: Not Currently   Drug use: No   Sexual activity: Yes    Birth control/protection: Inserts     ROS   Objective:   Vitals: BP 108/76 (BP Location: Right Arm)   Pulse 85   Temp  98.5 F (36.9 C) (Oral)   Resp 20   LMP 07/28/2024   SpO2 98%   Physical Exam Constitutional:      General: She is not in acute distress.    Appearance: Normal appearance. She is well-developed. She is not ill-appearing, toxic-appearing or diaphoretic.  HENT:     Head: Normocephalic and atraumatic.     Nose: Nose normal.     Mouth/Throat:     Mouth: Mucous membranes are moist.  Eyes:     General: No scleral icterus.       Right eye: No discharge.        Left eye: No discharge.     Extraocular Movements: Extraocular movements intact.  Cardiovascular:     Rate and Rhythm: Normal rate.  Pulmonary:     Effort: Pulmonary  effort is normal.  Skin:    General: Skin is warm and dry.  Neurological:     General: No focal deficit present.     Mental Status: She is alert and oriented to person, place, and time.  Psychiatric:        Mood and Affect: Mood normal.        Behavior: Behavior normal.     Assessment and Plan :   PDMP not reviewed this encounter.  1. Chronic sinusitis, unspecified location    Acute on chronic sinusitis.  Recommended starting with prednisone .  If she does not have improvement or resolution with the prednisone , can start Augmentin .  Follow-up with her previous ENT specialist for consultation regarding procedural intervention.  Unfortunately, she has had to undergo multiple rounds of antibiotics and steroids this year and recently due to this issue.  Patient is agreeable to seek a second opinion for long-term management of her chronic sinusitis.     [1]  Allergies Allergen Reactions   Shellfish Allergy Anaphylaxis   Sulfa Antibiotics Rash     Christopher Savannah, PA-C 08/01/24 1104  "
